# Patient Record
Sex: Female | Born: 1945 | Race: Black or African American | Hispanic: No | Marital: Single | Smoking: Former smoker
Health system: Southern US, Community
[De-identification: ages and names within clinical notes are randomized; demographics above are authoritative.]

## PROBLEM LIST (undated history)

## (undated) DIAGNOSIS — I10 Essential (primary) hypertension: Secondary | ICD-10-CM

## (undated) DIAGNOSIS — K219 Gastro-esophageal reflux disease without esophagitis: Secondary | ICD-10-CM

## (undated) DIAGNOSIS — C189 Malignant neoplasm of colon, unspecified: Secondary | ICD-10-CM

## (undated) DIAGNOSIS — M1990 Unspecified osteoarthritis, unspecified site: Secondary | ICD-10-CM

## (undated) DIAGNOSIS — R1310 Dysphagia, unspecified: Secondary | ICD-10-CM

## (undated) DIAGNOSIS — E785 Hyperlipidemia, unspecified: Secondary | ICD-10-CM

## (undated) DIAGNOSIS — F419 Anxiety disorder, unspecified: Secondary | ICD-10-CM

## (undated) DIAGNOSIS — G4733 Obstructive sleep apnea (adult) (pediatric): Secondary | ICD-10-CM

## (undated) DIAGNOSIS — F4321 Adjustment disorder with depressed mood: Secondary | ICD-10-CM

## (undated) DIAGNOSIS — I639 Cerebral infarction, unspecified: Secondary | ICD-10-CM

## (undated) DIAGNOSIS — E559 Vitamin D deficiency, unspecified: Secondary | ICD-10-CM

## (undated) DIAGNOSIS — F32A Depression, unspecified: Secondary | ICD-10-CM

## (undated) DIAGNOSIS — E118 Type 2 diabetes mellitus with unspecified complications: Secondary | ICD-10-CM

## (undated) HISTORY — DX: Vitamin D deficiency, unspecified: E55.9

## (undated) HISTORY — DX: Gastro-esophageal reflux disease without esophagitis: K21.9

## (undated) HISTORY — PX: ABDOMINAL HYSTERECTOMY: SHX81

## (undated) HISTORY — DX: Obstructive sleep apnea (adult) (pediatric): G47.33

## (undated) HISTORY — DX: Malignant neoplasm of colon, unspecified: C18.9

## (undated) HISTORY — DX: Adjustment disorder with depressed mood: F43.21

## (undated) HISTORY — DX: Dysphagia, unspecified: R13.10

## (undated) HISTORY — DX: Essential (primary) hypertension: I10

## (undated) HISTORY — DX: Cerebral infarction, unspecified: I63.9

## (undated) HISTORY — PX: COLON RESECTION: SHX5231

## (undated) HISTORY — DX: Hyperlipidemia, unspecified: E78.5

## (undated) HISTORY — DX: Type 2 diabetes mellitus with unspecified complications: E11.8

---

## 2016-05-21 ENCOUNTER — Encounter: Payer: Self-pay | Admitting: Gastroenterology

## 2016-05-21 HISTORY — PX: ESOPHAGOGASTRODUODENOSCOPY: SHX1529

## 2016-07-28 HISTORY — PX: COLONOSCOPY: SHX174

## 2018-04-21 DIAGNOSIS — Z853 Personal history of malignant neoplasm of breast: Secondary | ICD-10-CM

## 2018-04-21 HISTORY — DX: Personal history of malignant neoplasm of breast: Z85.3

## 2018-04-21 HISTORY — PX: BREAST LUMPECTOMY: SHX2

## 2019-04-14 ENCOUNTER — Encounter: Payer: Self-pay | Admitting: Family Medicine

## 2019-05-31 ENCOUNTER — Encounter: Payer: Self-pay | Admitting: Internal Medicine

## 2019-06-14 ENCOUNTER — Encounter: Payer: Self-pay | Admitting: Gastroenterology

## 2019-06-14 NOTE — Progress Notes (Signed)
Referring Provider: Michell Heinrich, DO Primary Care Physician:  Michell Heinrich, DO Primary Gastroenterologist:  Dr. Gala Romney  Chief Complaint  Patient presents with  . Abdominal Pain  . Dysphagia  . constipation to diarrhea    HPI:   Veronica Wiley is a 74 y.o. female presenting today at the request of Addis, Daniel, DO for diarrhea.  Patient also has concerns about upper abdominal pain and dysphagia.   Reviewed last 3 PCP notes in care everywhere dated 03/21/2019, 03/28/2019, and 05/31/2019.  Reports patient has history of GERD, dysphagia with 3 dilations in the past, and chronic intermittent diarrhea for greater than 2 years requesting GI referral outside of [place].  Prior referral to [facility] for dysphagia but appointment never made.  Reviewed labs in care everywhere dated 03/21/19. LFT, electrolytes, and kidney function within normal limits.  Hemoglobin, WBC, and platelets within normal limits.  Today: Abdominal Pain: Started 6 months-1 year ago. Mostly in the upper abdomen. Present every time she eats. States it can be anything she eats. Will be about a 7/10. Will last a few hours. Sometimes she will take a pepcid. This helps some. Quality is sharp at times. Feels very full in the upper abdomen. Admits to early satiety. Present for about 1 year.  Used to be on Protonix for 20 + years but PCP stopped this last Thursday. Has acid reflux symptoms several times a week. Some nausea. No vomiting. When she swallows, food will get stuck around sternal notch, other times at the very low esophagus. Has had esophageal dilation x 2. Last one was about 2 years ago. This does help with trouble swallowing for a while.    History of colon cancer in [place] about 15 years ago. Had partial colon resection on upper left side. No chemo. Last colonoscopy with Dr. Earley Brooke 4-5 years ago. Reports having a polyp.  States she is not sure when she supposed to have another one, but her PCP always lets her  know.  Admits to postprandial diarrhea. Typically loose/watery. About 4 BMs daily. Stools have always been loose/watery. Present for a couple of years.  Consumes dairy products fairly regularly. No blood in the stool. No black stools. Occasional cramping prior to a BM. Occasional constipation. May go 3 days without a BM. Not regularly. Never tried any medication in the past for this. No nocturnal stools.   On chemo pill now because of breast cancer. 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  Has had 3 surgeries since diagnosis.  States the surgeon told her he got it all with the last surgery.  No NSAIDs.   Past Medical History:  Diagnosis Date  . Adjustment disorder with depressed mood   . Dysphagia   . GERD (gastroesophageal reflux disease)   . History of breast cancer 2020  . Hyperlipidemia   . Malignant tumor of colon ([place])    About 15 years ago  . OSA (obstructive sleep apnea)    on CPAP  . 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 Type 2 diabetes mellitus with complications ([place])   . Vitamin D deficiency     Past Surgical History:  Procedure Laterality Date  . BREAST LUMPECTOMY Right 2020   x3  . COLON RESECTION     about 15 years ago; left-sided (per patient)  . COLONOSCOPY    . ESOPHAGOGASTRODUODENOSCOPY      Current Outpatient Medications  Medication Sig Dispense Refill  . aspirin EC 81  MG tablet Take 81 mg by mouth daily.    Marland Kitchen atorvastatin (LIPITOR) 80 MG tablet Take 80 mg by mouth daily.    . clopidogrel (PLAVIX) 75 MG tablet Take 75 mg by mouth daily.    . Insulin Infusion Pump DEVI by Does not apply route. As directed    . letrozole (FEMARA) 2.5 MG tablet Take 2.5 mg by mouth daily.    . NON FORMULARY Vitamin D3  2000 units daily    . sertraline (ZOLOFT) 100 MG tablet Take 100 mg by mouth daily.    Marland Kitchen losartan (COZAAR) 25 MG tablet Take 25 mg by mouth daily. Take I/2 tablet daily     No current facility-administered  medications for this visit.    Allergies as of 06/15/2019 - Review Complete 06/15/2019  Allergen Reaction Noted  . Enoxaparin Other (See Comments) 06/15/2019  . Ivp dye [iodinated diagnostic agents] Other (See Comments) 06/15/2019  . Penicillins Rash 06/15/2019    Family History  Problem Relation Age of Onset  . Colon cancer Neg Hx     Social History   Socioeconomic History  . Marital status: Single    Spouse name: Not on file  . Number of children: Not on file  . Years of education: Not on file  . Highest education level: Not on file  Occupational History  . Not on file  Tobacco Use  . Smoking status: Former Research scientist (life sciences)  . Smokeless tobacco: Never Used  . Tobacco comment: Never did smoke much just one or two daily  Substance and Sexual Activity  . Alcohol use: Not Currently  . Drug use: Never  . Sexual activity: Not on file  Other Topics Concern  . Not on file  Social History Narrative  . Not on file   Social Determinants of Health   Financial Resource Strain:   . Difficulty of Paying Living Expenses: Not on file  Food Insecurity:   . Worried About Charity fundraiser in the Last Year: Not on file  . Ran Out of Food in the Last Year: Not on file  Transportation Needs:   . Lack of Transportation (Medical): Not on file  . Lack of Transportation (Non-Medical): Not on file  Physical Activity:   . Days of Exercise per Week: Not on file  . Minutes of Exercise per Session: Not on file  Stress:   . Feeling of Stress : Not on file  Social Connections:   . Frequency of Communication with Friends and Family: Not on file  . Frequency of Social Gatherings with Friends and Family: Not on file  . Attends Religious Services: Not on file  . Active Member of Clubs or Organizations: Not on file  . Attends Archivist Meetings: Not on file  . Marital Status: Not on file  Intimate Partner Violence:   . Fear of Current or Ex-Partner: Not on file  . Emotionally Abused: Not  on file  . Physically Abused: Not on file  . Sexually Abused: Not on file    Review of Systems: Gen: Denies any fever, chills, lightheadedness, dizziness, presyncope, or syncope. CV: Denies chest pain or heart palpitations.  Resp: Occasional shortness of breath with exertion. No shortness of breath at rest. Denies  cough.  GI: See HPI GU : Denies urinary burning, urinary frequency, urinary hesitancy MS: Chronic knee pain. Takes tylenol as needed.  Derm: Denies rash Psych: Admits to depression.  No SI or HI.   Heme: Denies bruising or bleeding.  Physical Exam: BP (!) 144/77   Pulse (!) 116   Temp (!) 96.8 F (36 C) (Temporal)   Ht 5\' 2"  (1.575 m)   Wt 198 lb 9.6 oz (90.1 kg)   BMI 36.32 kg/m  General:   Alert and oriented. Pleasant and cooperative. Well-nourished and well-developed.   Head:  Normocephalic and atraumatic. Eyes:  Without icterus, sclera clear and conjunctiva pink.  Ears:  Normal auditory acuity. Lungs:  Clear to auscultation bilaterally. No wheezes, rales, or rhonchi. No distress.  Heart:  S1, S2 present without murmurs appreciated.  Tachycardia resolved after sitting. Abdomen:  +BS, soft, and non-distended. Moderate tenderness to palpation in the epigastric area. Mild tenderness in RUQ and LUQ. No HSM noted. No guarding or rebound. No masses appreciated.  Rectal:  Deferred  Msk:  Symmetrical without gross deformities. Normal posture. Extremities:  Without edema. Neurologic:  Alert and  oriented x4;  grossly normal neurologically. Skin:  Intact without significant lesions or rashes. Psych: Normal mood and affect.

## 2019-06-14 NOTE — H&P (View-Only) (Signed)
Referring Provider: Michell Heinrich, DO Primary Care Physician:  Michell Heinrich, DO Primary Gastroenterologist:  Dr. Gala Romney  Chief Complaint  Patient presents with  . Abdominal Pain  . Dysphagia  . constipation to diarrhea    HPI:   Veronica Wiley is a 74 y.o. female presenting today at the request of Addis, Daniel, DO for diarrhea.  Patient also has concerns about upper abdominal pain and dysphagia.   Reviewed last 3 PCP notes in care everywhere dated 03/21/2019, 03/28/2019, and 05/31/2019.  Reports patient has history of GERD, dysphagia with 3 dilations in the past, and chronic intermittent diarrhea for greater than 2 years requesting GI referral outside of [place].  Prior referral to [facility] for dysphagia but appointment never made.  Reviewed labs in care everywhere dated 03/21/19. LFT, electrolytes, and kidney function within normal limits.  Hemoglobin, WBC, and platelets within normal limits.  Today: Abdominal Pain: Started 6 months-1 year ago. Mostly in the upper abdomen. Present every time she eats. States it can be anything she eats. Will be about a 7/10. Will last a few hours. Sometimes she will take a pepcid. This helps some. Quality is sharp at times. Feels very full in the upper abdomen. Admits to early satiety. Present for about 1 year.  Used to be on Protonix for 20 + years but PCP stopped this last Thursday. Has acid reflux symptoms several times a week. Some nausea. No vomiting. When she swallows, food will get stuck around sternal notch, other times at the very low esophagus. Has had esophageal dilation x 2. Last one was about 2 years ago. This does help with trouble swallowing for a while.    History of colon cancer in [place] about 15 years ago. Had partial colon resection on upper left side. No chemo. Last colonoscopy with Dr. Earley Brooke 4-5 years ago. Reports having a polyp.  States she is not sure when she supposed to have another one, but her PCP always lets her  know.  Admits to postprandial diarrhea. Typically loose/watery. About 4 BMs daily. Stools have always been loose/watery. Present for a couple of years.  Consumes dairy products fairly regularly. No blood in the stool. No black stools. Occasional cramping prior to a BM. Occasional constipation. May go 3 days without a BM. Not regularly. Never tried any medication in the past for this. No nocturnal stools.   On chemo pill now because of breast cancer. 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  Has had 3 surgeries since diagnosis.  States the surgeon told her he got it all with the last surgery.  No NSAIDs.   Past Medical History:  Diagnosis Date  . Adjustment disorder with depressed mood   . Dysphagia   . GERD (gastroesophageal reflux disease)   . History of breast cancer 2020  . Hyperlipidemia   . Malignant tumor of colon ([place])    About 15 years ago  . OSA (obstructive sleep apnea)    on CPAP  . 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 Type 2 diabetes mellitus with complications ([place])   . Vitamin D deficiency     Past Surgical History:  Procedure Laterality Date  . BREAST LUMPECTOMY Right 2020   x3  . COLON RESECTION     about 15 years ago; left-sided (per patient)  . COLONOSCOPY    . ESOPHAGOGASTRODUODENOSCOPY      Current Outpatient Medications  Medication Sig Dispense Refill  . aspirin EC 81  MG tablet Take 81 mg by mouth daily.    Marland Kitchen atorvastatin (LIPITOR) 80 MG tablet Take 80 mg by mouth daily.    . clopidogrel (PLAVIX) 75 MG tablet Take 75 mg by mouth daily.    . Insulin Infusion Pump DEVI by Does not apply route. As directed    . letrozole (FEMARA) 2.5 MG tablet Take 2.5 mg by mouth daily.    . NON FORMULARY Vitamin D3  2000 units daily    . sertraline (ZOLOFT) 100 MG tablet Take 100 mg by mouth daily.    Marland Kitchen losartan (COZAAR) 25 MG tablet Take 25 mg by mouth daily. Take I/2 tablet daily     No current facility-administered  medications for this visit.    Allergies as of 06/15/2019 - Review Complete 06/15/2019  Allergen Reaction Noted  . Enoxaparin Other (See Comments) 06/15/2019  . Ivp dye [iodinated diagnostic agents] Other (See Comments) 06/15/2019  . Penicillins Rash 06/15/2019    Family History  Problem Relation Age of Onset  . Colon cancer Neg Hx     Social History   Socioeconomic History  . Marital status: Single    Spouse name: Not on file  . Number of children: Not on file  . Years of education: Not on file  . Highest education level: Not on file  Occupational History  . Not on file  Tobacco Use  . Smoking status: Former Research scientist (life sciences)  . Smokeless tobacco: Never Used  . Tobacco comment: Never did smoke much just one or two daily  Substance and Sexual Activity  . Alcohol use: Not Currently  . Drug use: Never  . Sexual activity: Not on file  Other Topics Concern  . Not on file  Social History Narrative  . Not on file   Social Determinants of Health   Financial Resource Strain:   . Difficulty of Paying Living Expenses: Not on file  Food Insecurity:   . Worried About Charity fundraiser in the Last Year: Not on file  . Ran Out of Food in the Last Year: Not on file  Transportation Needs:   . Lack of Transportation (Medical): Not on file  . Lack of Transportation (Non-Medical): Not on file  Physical Activity:   . Days of Exercise per Week: Not on file  . Minutes of Exercise per Session: Not on file  Stress:   . Feeling of Stress : Not on file  Social Connections:   . Frequency of Communication with Friends and Family: Not on file  . Frequency of Social Gatherings with Friends and Family: Not on file  . Attends Religious Services: Not on file  . Active Member of Clubs or Organizations: Not on file  . Attends Archivist Meetings: Not on file  . Marital Status: Not on file  Intimate Partner Violence:   . Fear of Current or Ex-Partner: Not on file  . Emotionally Abused: Not  on file  . Physically Abused: Not on file  . Sexually Abused: Not on file    Review of Systems: Gen: Denies any fever, chills, lightheadedness, dizziness, presyncope, or syncope. CV: Denies chest pain or heart palpitations.  Resp: Occasional shortness of breath with exertion. No shortness of breath at rest. Denies  cough.  GI: See HPI GU : Denies urinary burning, urinary frequency, urinary hesitancy MS: Chronic knee pain. Takes tylenol as needed.  Derm: Denies rash Psych: Admits to depression.  No SI or HI.   Heme: Denies bruising or bleeding.  Physical Exam: BP (!) 144/77   Pulse (!) 116   Temp (!) 96.8 F (36 C) (Temporal)   Ht 5\' 2"  (1.575 m)   Wt 198 lb 9.6 oz (90.1 kg)   BMI 36.32 kg/m  General:   Alert and oriented. Pleasant and cooperative. Well-nourished and well-developed.   Head:  Normocephalic and atraumatic. Eyes:  Without icterus, sclera clear and conjunctiva pink.  Ears:  Normal auditory acuity. Lungs:  Clear to auscultation bilaterally. No wheezes, rales, or rhonchi. No distress.  Heart:  S1, S2 present without murmurs appreciated.  Tachycardia resolved after sitting. Abdomen:  +BS, soft, and non-distended. Moderate tenderness to palpation in the epigastric area. Mild tenderness in RUQ and LUQ. No HSM noted. No guarding or rebound. No masses appreciated.  Rectal:  Deferred  Msk:  Symmetrical without gross deformities. Normal posture. Extremities:  Without edema. Neurologic:  Alert and  oriented x4;  grossly normal neurologically. Skin:  Intact without significant lesions or rashes. Psych: Normal mood and affect.

## 2019-06-15 ENCOUNTER — Ambulatory Visit (INDEPENDENT_AMBULATORY_CARE_PROVIDER_SITE_OTHER): Payer: Medicare Other | Admitting: Gastroenterology

## 2019-06-15 ENCOUNTER — Encounter: Payer: Self-pay | Admitting: Gastroenterology

## 2019-06-15 ENCOUNTER — Other Ambulatory Visit: Payer: Self-pay

## 2019-06-15 VITALS — BP 144/77 | HR 116 | Temp 96.8°F | Ht 62.0 in | Wt 198.6 lb

## 2019-06-15 DIAGNOSIS — R1010 Upper abdominal pain, unspecified: Secondary | ICD-10-CM | POA: Diagnosis not present

## 2019-06-15 DIAGNOSIS — R1310 Dysphagia, unspecified: Secondary | ICD-10-CM

## 2019-06-15 DIAGNOSIS — R6881 Early satiety: Secondary | ICD-10-CM | POA: Diagnosis not present

## 2019-06-15 DIAGNOSIS — K219 Gastro-esophageal reflux disease without esophagitis: Secondary | ICD-10-CM | POA: Diagnosis not present

## 2019-06-15 DIAGNOSIS — Z85038 Personal history of other malignant neoplasm of large intestine: Secondary | ICD-10-CM

## 2019-06-15 DIAGNOSIS — R197 Diarrhea, unspecified: Secondary | ICD-10-CM

## 2019-06-15 NOTE — Patient Instructions (Addendum)
We will get you scheduled for an upper endoscopy with possible dilation in the near future with Dr. Gala Romney.   Please have labs completed.   Try Dexilant 60 mg daily. We will provide samples. Call in 2 weeks with a progress report. I will send in prescription if this works well.   Avoid fried, fatty, greasy, spicy, citrus foods. Avoid caffeine, carbonated beverages, and chocolate. No not eat within 3 hours of laying down.   Try eating 4-6 small meals daily rather than 3 main meals.   For diarrhea, follow a lactose free diet or take lactacid tablets prior to eating any dairy products. See handout below.   I am requesting your prior colonoscopy and EGD records.   We will see you back after your procedures. Call in 2 weeks with progress report.   Veronica Altes, PA-C [facility]      Food Choices for Gastroesophageal Reflux Disease, Adult When you have gastroesophageal reflux disease (GERD), the foods you eat and your eating habits are very important. Choosing the right foods can help ease your discomfort. Think about working with a nutrition specialist (dietitian) to help you make good choices. What are tips for following this plan?  Meals  Choose healthy foods that are low in fat, such as fruits, vegetables, whole grains, low-fat dairy products, and lean meat, fish, and poultry.  Eat small meals often instead of 3 large meals a day. Eat your meals slowly, and in a place where you are relaxed. Avoid bending over or lying down until 2-3 hours after eating.  Avoid eating meals 2-3 hours before bed.  Avoid drinking a lot of liquid with meals.  Cook foods using methods other than frying. Bake, grill, or broil food instead.  Avoid or limit: ? Chocolate. ? Peppermint or spearmint. ? Alcohol. ? Pepper. ? Black and decaffeinated coffee. ? Black and decaffeinated tea. ? Bubbly (carbonated) soft drinks. ? Caffeinated energy drinks and soft drinks.  Limit high-fat  foods such as: ? Fatty meat or fried foods. ? Whole milk, cream, butter, or ice cream. ? Nuts and nut butters. ? Pastries, donuts, and sweets made with butter or shortening.  Avoid foods that cause symptoms. These foods may be different for everyone. Common foods that cause symptoms include: ? Tomatoes. ? Oranges, lemons, and limes. ? Peppers. ? Spicy food. ? Onions and garlic. ? Vinegar. Lifestyle  Maintain a healthy weight. Ask your doctor what weight is healthy for you. If you need to lose weight, work with your doctor to do so safely.  Exercise for at least 30 minutes for 5 or more days each week, or as told by your doctor.  Wear loose-fitting clothes.  Do not smoke. If you need help quitting, ask your doctor.  Sleep with the head of your bed higher than your feet. Use a wedge under the mattress or blocks under the bed frame to raise the head of the bed. Summary  When you have gastroesophageal reflux disease (GERD), food and lifestyle choices are very important in easing your symptoms.  Eat small meals often instead of 3 large meals a day. Eat your meals slowly, and in a place where you are relaxed.  Limit high-fat foods such as fatty meat or fried foods.  Avoid bending over or lying down until 2-3 hours after eating.  Avoid peppermint and spearmint, caffeine, alcohol, and chocolate. This information is not intended to replace advice given to you by your health care provider. Make sure you discuss  any questions you have with your health care provider. Document Revised: 07/29/2018 Document Reviewed: 05/13/2016 Elsevier Patient Education  [place].  Lactose-Free Diet, Adult If you have lactose intolerance, you are not able to digest lactose. Lactose is a natural sugar found mainly in dairy milk and dairy products. You may need to avoid all foods and beverages that contain lactose. A lactose-free diet can help you do this. Which foods have lactose? Lactose is  found in dairy milk and dairy products, such as:  Yogurt.  Cheese.  Butter.  Margarine.  Sour cream.  Cream.  Whipped toppings and nondairy creamers.  Ice cream and other dairy-based desserts. Lactose is also found in foods or products made with dairy milk or milk ingredients. To find out whether a food contains dairy milk or a milk ingredient, look at the ingredients list. Avoid foods with the statement "May contain milk" and foods that contain:  Milk powder.  Whey.  Curd.  Caseinate.  Lactose.  Lactalbumin.  Lactoglobulin. What are alternatives to dairy milk and foods made with milk products?  Lactose-free milk.  Soy milk with added calcium and vitamin D.  Almond milk, coconut milk, rice milk, or other nondairy milk alternatives with added calcium and vitamin D. Note that these are low in protein.  Soy products, such as soy yogurt, soy cheese, soy ice cream, and soy-based sour cream.  Other nut milk products, such as almond yogurt, almond cheese, cashew yogurt, cashew cheese, cashew ice cream, coconut yogurt, and coconut ice cream. What are tips for following this plan?  Do not consume foods, beverages, vitamins, minerals, or medicines containing lactose. Read ingredient lists carefully.  Look for the words "lactose-free" on labels.  Use lactase enzyme drops or tablets as directed by your health care provider.  Use lactose-free milk or a milk alternative, such as soy milk or almond milk, for drinking and cooking.  Make sure you get enough calcium and vitamin D in your diet. A lactose-free eating plan can be lacking in these important nutrients.  Take calcium and vitamin D supplements as directed by your health care provider. Talk to your health care provider about supplements if you are not able to get enough calcium and vitamin D from food. What foods can I eat?  Fruits All fresh, canned, frozen, or dried fruits that are not processed with  lactose. Vegetables All fresh, frozen, and canned vegetables without cheese, cream, or butter sauces. Grains Any that are not made with dairy milk or dairy products. Meats and other proteins Any meat, fish, poultry, and other protein sources that are not made with dairy milk or dairy products. Soy cheese and yogurt. Fats and oils Any that are not made with dairy milk or dairy products. Beverages Lactose-free milk. Soy, rice, or almond milk with added calcium and vitamin D. Fruit and vegetable juices. Sweets and desserts Any that are not made with dairy milk or dairy products. Seasonings and condiments Any that are not made with dairy milk or dairy products. Calcium Calcium is found in many foods that contain lactose and is important for bone health. The amount of calcium you need depends on your age:  Adults younger than 50 years: 1,000 mg of calcium a day.  Adults older than 50 years: 1,200 mg of calcium a day. If you are not getting enough calcium, you may get it from other sources, including:  Orange juice with calcium added. There are 300-350 mg of calcium in 1 cup of orange  juice.  Calcium-fortified soy milk. There are 300-400 mg of calcium in 1 cup of calcium-fortified soy milk.  Calcium-fortified rice or almond milk. There are 300 mg of calcium in 1 cup of calcium-fortified rice or almond milk.  Calcium-fortified breakfast cereals. There are 100-1,000 mg of calcium in calcium-fortified breakfast cereals.  Spinach, cooked. There are 145 mg of calcium in  cup of cooked spinach.  Edamame, cooked. There are 130 mg of calcium in  cup of cooked edamame.  Collard greens, cooked. There are 125 mg of calcium in  cup of cooked collard greens.  Kale, frozen or cooked. There are 90 mg of calcium in  cup of cooked or frozen kale.  Almonds. There are 95 mg of calcium in  cup of almonds.  Broccoli, cooked. There are 60 mg of calcium in 1 cup of cooked broccoli. The items listed  above may not be a complete list of recommended foods and beverages. Contact a dietitian for more options. What foods are not recommended? Fruits None, unless they are made with dairy milk or dairy products. Vegetables None, unless they are made with dairy milk or dairy products. Grains Any grains that are made with dairy milk or dairy products. Meats and other proteins None, unless they are made with dairy milk or dairy products. Dairy All dairy products, including milk, goat's milk, buttermilk, kefir, acidophilus milk, flavored milk, evaporated milk, condensed milk, dulce de Whitefish, eggnog, yogurt, cheese, and cheese spreads. Fats and oils Any that are made with milk or milk products. Margarines and salad dressings that contain milk or cheese. Cream. Half and half. Cream cheese. Sour cream. Chip dips made with sour cream or yogurt. Beverages Hot chocolate. Cocoa with lactose. Instant iced teas. Powdered fruit drinks. Smoothies made with dairy milk or yogurt. Sweets and desserts Any that are made with milk or milk products. Seasonings and condiments Chewing gum that has lactose. Spice blends if they contain lactose. Artificial sweeteners that contain lactose. Nondairy creamers. The items listed above may not be a complete list of foods and beverages to avoid. Contact a dietitian for more information. Summary  If you are lactose intolerant, it means that you have a hard time digesting lactose, a natural sugar found in milk and milk products.  Following a lactose-free diet can help you manage this condition.  Calcium is important for bone health and is found in many foods that contain lactose. Talk with your health care provider about other sources of calcium. This information is not intended to replace advice given to you by your health care provider. Make sure you discuss any questions you have with your health care provider. Document Revised: 05/05/2017 Document Reviewed:  05/05/2017 Elsevier Patient Education  2020 [employer].

## 2019-06-16 ENCOUNTER — Telehealth: Payer: Self-pay

## 2019-06-16 NOTE — Telephone Encounter (Signed)
Called pt, ASAP EGD/-/+DIL w/RMR scheduled for 07/01/19 at 2:45pm. COVID test 06/29/19 at 10:00am. Orders entered. Appt letter and procedure instructions mailed.

## 2019-06-19 ENCOUNTER — Encounter: Payer: Self-pay | Admitting: Gastroenterology

## 2019-06-19 DIAGNOSIS — K219 Gastro-esophageal reflux disease without esophagitis: Secondary | ICD-10-CM | POA: Insufficient documentation

## 2019-06-19 DIAGNOSIS — R197 Diarrhea, unspecified: Secondary | ICD-10-CM | POA: Insufficient documentation

## 2019-06-19 DIAGNOSIS — R1310 Dysphagia, unspecified: Secondary | ICD-10-CM | POA: Insufficient documentation

## 2019-06-19 DIAGNOSIS — Z85038 Personal history of other malignant neoplasm of large intestine: Secondary | ICD-10-CM | POA: Insufficient documentation

## 2019-06-19 DIAGNOSIS — R1010 Upper abdominal pain, unspecified: Secondary | ICD-10-CM | POA: Insufficient documentation

## 2019-06-19 DIAGNOSIS — R6881 Early satiety: Secondary | ICD-10-CM | POA: Insufficient documentation

## 2019-06-19 NOTE — Assessment & Plan Note (Addendum)
74 year old female with history significant for GERD, diabetes, and colon cancer reporting postprandial upper abdominal pain x6 months-1 year.  Pepcid improves symptoms.  Also admits to upper abdominal fullness and early satiety x1 year.  Chronic GERD; had been on Protonix for 20+ years, but PCP discontinued this about 1 week ago until she followed up with GI.  Now with GERD symptoms several times a week along with nausea but no vomiting.  No melena or bright red blood per rectum.  No NSAIDs.  Abdominal exam with moderate tenderness to palpation in the epigastric area.  Mild tenderness to palpation in the RUQ and LUQ.  Differentials include gastritis, esophagitis, duodenitis, PUD, H. pylori, gastroparesis, pyloric stenosis, biliary etiology, or malignancy.  Doubt pancreatitis considering chronicity.  Patient needs daily PPI therapy and EGD for further evaluation.  Update CBC, CMP, and lipase. Trial Dexilant 60 mg daily.  Samples provided.  Requested 2-week progress report.  We will send in prescription if Dexilant works well. Proceed with EGD +/- dilation with Dr. Gala Romney in the near future. The risks, benefits, and alternatives have been discussed in detail with patient. They have stated understanding and desire to proceed.  Counseled on GERD diet and lifestyle.  Handout provided. Follow-up after procedure.  Call if questions or concerns prior.

## 2019-06-19 NOTE — Assessment & Plan Note (Addendum)
74 year old female reporting chronic history of GERD.  Had been on Protonix for 20+ years, but PCP advised she discontinue this medication until following up with GI.  Currently with GERD symptoms several times a week, nausea, dysphagia, postprandial epigastric pain that improves some with Pepcid, and early satiety.  Denies NSAIDs.  Denies bright red blood per rectum or melena.  Per patient, she has had 2 esophageal dilations in the past that do improve dysphagia symptoms.  Patient needs to be on PPI for chronic GERD symptoms.  Considering other upper GI symptoms, patient needs EGD for further evaluation.  Differentials include gastritis, esophagitis, duodenitis, PUD, H. pylori, gastroparesis, pyloric stenosis, biliary etiology, or possible malignancy.   Update CBC, CMP, and lipase. Trial Dexilant 60 mg daily.  Samples provided.  Patient will call with progress report in 2 weeks.  If this works well, I will send in a prescription. Proceed with EGD +/- dilation with Dr. Gala Romney in the near future. The risks, benefits, and alternatives have been discussed in detail with patient. They have stated understanding and desire to proceed.  Counseled on GERD diet and lifestyle.  Handout provided. Request EGD records from Dr. Earley Brooke.  Follow-up after procedures.  Call if questions or concerns prior.

## 2019-06-19 NOTE — Assessment & Plan Note (Signed)
Addressed under pain of upper abdomen as well as GERD.

## 2019-06-19 NOTE — Assessment & Plan Note (Addendum)
Dysphagia with symptoms of food getting stuck around the sternal notch or very low esophagus.  This is in the setting of chronic GERD which is currently uncontrolled as patient is off PPI.  Reports 2 esophageal dilations in the past which have helped with dysphagia symptoms.  Suspect esophageal web, ring, or stricture.  Cannot rule out malignancy.  Proceed with EGD +/- dilation with Dr. Gala Romney in the near future. The risks, benefits, and alternatives have been discussed in detail with patient. They have stated understanding and desire to proceed.  Trial Dexilant 60 mg daily.  Samples provided.  Requested 2-week progress report.  Will provide prescription if Dexilant works well. Counseled on GERD diet and lifestyle.  Handout provided. Request prior EGD records from Dr. Earley Brooke.  Follow-up after procedures.  Call if questions or concerns prior.

## 2019-06-19 NOTE — Assessment & Plan Note (Signed)
74 year old female reporting history of colon cancer about 15 years ago in [place] s/p partial left-sided colectomy and no adjuvant therapy.  She reports her last colonoscopy was 4-5 years ago with Dr. Earley Brooke.  Thinks she had a polyp.  Unsure when her next colonoscopy is due.  Requesting colonoscopy records from Dr. Earley Brooke to determine timing of next colonoscopy. Further recommendations to follow.

## 2019-06-19 NOTE — Assessment & Plan Note (Addendum)
74 year old female reporting chronic (at least 2-year history) of loose/watery stools with about 4 BMs daily.  Stools are typically postprandial.  Occasional cramping prior to BMs.  Denies nocturnal stools.  Consumes dairy products frequently. No NSAIDs. She does take Zoloft. Denies bright red blood per rectum or melena.  Per patient, history of colon cancer about 15 years ago with partial left-sided colectomy in [place] with no adjunct therapy required.  She reports her last colonoscopy was with Dr. Earley Brooke about 4-5 years ago.  Unsure when her next colonoscopy is due.   Differentials include dietary intolerances (lactose or gluten), IBS, or diarrhea secondary to changes in anatomy s/p partial colectomy. Can't rule out thyroid abnormalities or microscopic colitis. Doubt infectious etiology considering chronicity, lack of profuse watery diarrhea, and no nocturnal symptoms.   Check CBC, CMP, TSH, IgA, TTG IgA Advised she follow a lactose-free diet or take Lactaid pills prior to consuming any dairy products at least for the next 2 weeks.  Lactose-free diet handout provided. Request prior colonoscopy records from Dr. Earley Brooke to determine timing of next colonoscopy. If labs are unremarkable and there is no improvement with lactose-free diet, will likely try Imodium.  Ultimately, she may need colonoscopy for further evaluation.  Due to significant upper GI issues as discussed above, she is being scheduled for EGD +/- dilation in the near future.   Plans to follow-up after EGD but advised to call if she had questions or concerns prior.

## 2019-06-21 ENCOUNTER — Encounter: Payer: Self-pay | Admitting: Gastroenterology

## 2019-06-21 ENCOUNTER — Telehealth: Payer: Self-pay | Admitting: Gastroenterology

## 2019-06-21 NOTE — Telephone Encounter (Signed)
Received and reviewed most recent colonoscopy and EGD completed by Dr. Earley Brooke.  Colonoscopy 07/28/2016 for colon cancer surveillance Findings: Hemorrhoids, diverticulosis (few in the sigmoid colon), colon polyps (two 2-3 mm polyps biopsied from the rectum). Pathology revealed hyperplastic polyp.  Based off history of colon cancer and this colonoscopy report, patient would be due for repeat colonoscopy in 2023, health permitting.  EGD 05/21/2016 for dysphagia Findings: Hiatal hernia, polyps (2-3 mm inflammatory looking polyps in the antrum, biopsies taken).  Esophageal balloon dilation performed.  No further recommendations at this time.  We will proceed with EGD as planned and follow-up in office thereafter.  We will have TCS and EGD report scanned into patient's chart.

## 2019-06-29 ENCOUNTER — Other Ambulatory Visit (HOSPITAL_COMMUNITY)
Admission: RE | Admit: 2019-06-29 | Discharge: 2019-06-29 | Disposition: A | Discharge status: Home / Self Care | Payer: Medicare Other | Source: Ambulatory Visit | Attending: Internal Medicine | Admitting: Internal Medicine

## 2019-06-29 ENCOUNTER — Other Ambulatory Visit: Payer: Self-pay

## 2019-06-29 DIAGNOSIS — Z20822 Contact with and (suspected) exposure to covid-19: Secondary | ICD-10-CM | POA: Diagnosis not present

## 2019-06-29 DIAGNOSIS — Z01812 Encounter for preprocedural laboratory examination: Secondary | ICD-10-CM | POA: Diagnosis present

## 2019-06-29 LAB — SARS CORONAVIRUS 2 (TAT 6-24 HRS): SARS Coronavirus 2: NEGATIVE

## 2019-07-01 ENCOUNTER — Encounter (HOSPITAL_COMMUNITY)
Admission: RE | Disposition: A | Discharge status: Home / Self Care | Payer: Self-pay | Source: Home / Self Care | Attending: Internal Medicine

## 2019-07-01 ENCOUNTER — Ambulatory Visit (HOSPITAL_COMMUNITY)
Admission: RE | Admit: 2019-07-01 | Discharge: 2019-07-01 | Disposition: A | Discharge status: Home / Self Care | Payer: Medicare Other | Attending: Internal Medicine | Admitting: Internal Medicine

## 2019-07-01 ENCOUNTER — Other Ambulatory Visit: Payer: Self-pay

## 2019-07-01 ENCOUNTER — Encounter (HOSPITAL_COMMUNITY): Payer: Self-pay | Admitting: Internal Medicine

## 2019-07-01 ENCOUNTER — Encounter: Payer: Self-pay | Admitting: Internal Medicine

## 2019-07-01 DIAGNOSIS — R109 Unspecified abdominal pain: Secondary | ICD-10-CM | POA: Diagnosis present

## 2019-07-01 DIAGNOSIS — R197 Diarrhea, unspecified: Secondary | ICD-10-CM | POA: Insufficient documentation

## 2019-07-01 DIAGNOSIS — K209 Esophagitis, unspecified without bleeding: Secondary | ICD-10-CM | POA: Insufficient documentation

## 2019-07-01 DIAGNOSIS — Z79811 Long term (current) use of aromatase inhibitors: Secondary | ICD-10-CM | POA: Diagnosis not present

## 2019-07-01 DIAGNOSIS — E785 Hyperlipidemia, unspecified: Secondary | ICD-10-CM | POA: Insufficient documentation

## 2019-07-01 DIAGNOSIS — E119 Type 2 diabetes mellitus without complications: Secondary | ICD-10-CM | POA: Diagnosis not present

## 2019-07-01 DIAGNOSIS — Z7902 Long term (current) use of antithrombotics/antiplatelets: Secondary | ICD-10-CM | POA: Diagnosis not present

## 2019-07-01 DIAGNOSIS — K2950 Unspecified chronic gastritis without bleeding: Secondary | ICD-10-CM | POA: Insufficient documentation

## 2019-07-01 DIAGNOSIS — C50919 Malignant neoplasm of unspecified site of unspecified female breast: Secondary | ICD-10-CM | POA: Insufficient documentation

## 2019-07-01 DIAGNOSIS — Z79899 Other long term (current) drug therapy: Secondary | ICD-10-CM | POA: Insufficient documentation

## 2019-07-01 DIAGNOSIS — Z794 Long term (current) use of insulin: Secondary | ICD-10-CM | POA: Diagnosis not present

## 2019-07-01 DIAGNOSIS — I69354 Hemiplegia and hemiparesis following cerebral infarction affecting left non-dominant side: Secondary | ICD-10-CM | POA: Diagnosis not present

## 2019-07-01 DIAGNOSIS — R1310 Dysphagia, unspecified: Secondary | ICD-10-CM | POA: Diagnosis not present

## 2019-07-01 DIAGNOSIS — G4733 Obstructive sleep apnea (adult) (pediatric): Secondary | ICD-10-CM | POA: Insufficient documentation

## 2019-07-01 DIAGNOSIS — R1010 Upper abdominal pain, unspecified: Secondary | ICD-10-CM | POA: Insufficient documentation

## 2019-07-01 DIAGNOSIS — Z87891 Personal history of nicotine dependence: Secondary | ICD-10-CM | POA: Insufficient documentation

## 2019-07-01 DIAGNOSIS — Z7982 Long term (current) use of aspirin: Secondary | ICD-10-CM | POA: Diagnosis not present

## 2019-07-01 DIAGNOSIS — Z85038 Personal history of other malignant neoplasm of large intestine: Secondary | ICD-10-CM | POA: Insufficient documentation

## 2019-07-01 DIAGNOSIS — K228 Other specified diseases of esophagus: Secondary | ICD-10-CM

## 2019-07-01 HISTORY — PX: BIOPSY: SHX5522

## 2019-07-01 HISTORY — PX: ESOPHAGOGASTRODUODENOSCOPY: SHX5428

## 2019-07-01 HISTORY — PX: MALONEY DILATION: SHX5535

## 2019-07-01 LAB — CBC
HCT: 37.2 % (ref 36.0–46.0)
Hemoglobin: 11.3 g/dL — ABNORMAL LOW (ref 12.0–15.0)
MCH: 27.6 pg (ref 26.0–34.0)
MCHC: 30.4 g/dL (ref 30.0–36.0)
MCV: 91 fL (ref 80.0–100.0)
Platelets: 278 10*3/uL (ref 150–400)
RBC: 4.09 MIL/uL (ref 3.87–5.11)
RDW: 13.2 % (ref 11.5–15.5)
WBC: 9.1 10*3/uL (ref 4.0–10.5)
nRBC: 0 % (ref 0.0–0.2)

## 2019-07-01 LAB — COMPREHENSIVE METABOLIC PANEL
ALT: 20 U/L (ref 0–44)
AST: 27 U/L (ref 15–41)
Albumin: 3.2 g/dL — ABNORMAL LOW (ref 3.5–5.0)
Alkaline Phosphatase: 100 U/L (ref 38–126)
Anion gap: 6 (ref 5–15)
BUN: 10 mg/dL (ref 8–23)
CO2: 27 mmol/L (ref 22–32)
Calcium: 8.8 mg/dL — ABNORMAL LOW (ref 8.9–10.3)
Chloride: 108 mmol/L (ref 98–111)
Creatinine, Ser: 0.52 mg/dL (ref 0.44–1.00)
GFR calc Af Amer: >60 mL/min (ref >60)
GFR calc non Af Amer: >60 mL/min (ref >60)
Glucose, Bld: 87 mg/dL (ref 70–99)
Potassium: 3.7 mmol/L (ref 3.5–5.1)
Sodium: 141 mmol/L (ref 135–145)
Total Bilirubin: 0.4 mg/dL (ref 0.3–1.2)
Total Protein: 6.5 g/dL (ref 6.5–8.1)

## 2019-07-01 LAB — LIPASE, BLOOD: Lipase: 16 U/L (ref 11–51)

## 2019-07-01 LAB — GLUCOSE, CAPILLARY: Glucose-Capillary: 105 mg/dL — ABNORMAL HIGH (ref 70–99)

## 2019-07-01 SURGERY — EGD (ESOPHAGOGASTRODUODENOSCOPY)
Anesthesia: Moderate Sedation

## 2019-07-01 MED ORDER — ONDANSETRON HCL 4 MG/2ML IJ SOLN
INTRAMUSCULAR | Status: AC
  Filled 2019-07-01: qty 2

## 2019-07-01 MED ORDER — MIDAZOLAM HCL 5 MG/5ML IJ SOLN
INTRAMUSCULAR | Status: AC
  Filled 2019-07-01: qty 10

## 2019-07-01 MED ORDER — STERILE WATER FOR IRRIGATION IR SOLN
Status: DC | PRN
  Administered 2019-07-01: 2.5 mL

## 2019-07-01 MED ORDER — MIDAZOLAM HCL 5 MG/5ML IJ SOLN
INTRAMUSCULAR | Status: DC | PRN
  Administered 2019-07-01: 1 mg via INTRAVENOUS
  Administered 2019-07-01 (×2): 2 mg via INTRAVENOUS

## 2019-07-01 MED ORDER — MEPERIDINE HCL 50 MG/ML IJ SOLN
INTRAMUSCULAR | Status: AC
  Filled 2019-07-01: qty 1

## 2019-07-01 MED ORDER — LIDOCAINE VISCOUS HCL 2 % MT SOLN
OROMUCOSAL | Status: AC
  Filled 2019-07-01: qty 15

## 2019-07-01 MED ORDER — ONDANSETRON HCL 4 MG/2ML IJ SOLN
INTRAMUSCULAR | Status: DC | PRN
  Administered 2019-07-01: 4 mg via INTRAVENOUS

## 2019-07-01 MED ORDER — SODIUM CHLORIDE 0.9 % IV SOLN: INTRAVENOUS | Status: DC

## 2019-07-01 MED ORDER — LIDOCAINE VISCOUS HCL 2 % MT SOLN
OROMUCOSAL | Status: DC | PRN
  Administered 2019-07-01: 1 via OROMUCOSAL

## 2019-07-01 MED ORDER — MEPERIDINE HCL 100 MG/ML IJ SOLN
INTRAMUSCULAR | Status: DC | PRN
  Administered 2019-07-01: 25 mg via INTRAVENOUS

## 2019-07-01 NOTE — Interval H&P Note (Signed)
History and Physical Interval Note:  07/01/2019 12:04 PM  Veronica Wiley  has presented today for surgery, with the diagnosis of GERD, dysphagia, upper abdominal pain, early satiety.  The various methods of treatment have been discussed with the patient and family. After consideration of risks, benefits and other options for treatment, the patient has consented to  Procedure(s) with comments: ESOPHAGOGASTRODUODENOSCOPY (EGD) (N/A) - 2:45pm MALONEY DILATION (N/A) as a surgical intervention.  The patient's history has been reviewed, patient examined, no change in status, stable for surgery.  I have reviewed the patient's chart and labs.  Questions were answered to the patient's satisfaction.     Manus Rudd  Patient states Dexilant has helped reflux.  Still has postprandial upper abdominal discomfort.  Still has dysphagia.  Did not get labs done.  EGD with possible esophageal dilation today as feasible/appropriate. Patient states that she still has her gallbladder no imaging lately.  The risks, benefits, limitations, alternatives and imponderables have been reviewed with the patient. Potential for esophageal dilation, biopsy, etc. have also been reviewed.  Questions have been answered. All parties agreeable.

## 2019-07-01 NOTE — Discharge Instructions (Signed)

## 2019-07-01 NOTE — Op Note (Signed)
[facility] Patient Name: Veronica Wiley Procedure Date: 07/01/2019 11:53 AM MRN: [id number] Date of Birth: 1946/02/08 Attending MD: Norvel Richards , MD CSN: [id number] Age: 74 Admit Type: Outpatient Procedure:                Upper GI endoscopy Indications:              Upper abdominal pain, Dysphagia Providers:                Norvel Richards, MD, Lurline Del, RN, Randa Spike, Technician Referring MD:              Medicines:                Midazolam 5 mg IV, Meperidine 25 mg IV, Ondansetron                            4 mg IV Complications:            No immediate complications. Estimated Blood Loss:     Estimated blood loss was minimal. Procedure:                Pre-Anesthesia Assessment:                           - Prior to the procedure, a History and Physical                            was performed, and patient medications and                            allergies were reviewed. The patient's tolerance of                            previous anesthesia was also reviewed. The risks                            and benefits of the procedure and the sedation                            options and risks were discussed with the patient.                            All questions were answered, and informed consent                            was obtained. Prior Anticoagulants: The patient has                            taken no previous anticoagulant or antiplatelet                            agents. ASA Grade Assessment: II - A patient with  mild systemic disease. After reviewing the risks                            and benefits, the patient was deemed in                            satisfactory condition to undergo the procedure.                           After obtaining informed consent, the endoscope was                            passed under direct vision. Throughout the                            procedure, the  patient's blood pressure, pulse, and                            oxygen saturations were monitored continuously. The                            [id number] scope was introduced through the                            mouth, and advanced to the second part of duodenum.                            The upper GI endoscopy was accomplished without                            difficulty. The patient tolerated the procedure                            well. Scope In: 12:18:48 PM Scope Out: 12:30:28 PM Total Procedure Duration: 0 hours 11 minutes 40 seconds  Findings:      5 mm area normal mucosa "island" just above the GE junction. May be       columnar epithelium. However, overlying mucosa looked a little       irregular. Please see photos. Otherwise, esophagus appeared normal.       Patent throughout its course. Nodular GE junction. No obvious ulcer.       Patent pylorus. Normal first and second portion of the duodenum The       scope was withdrawn. Dilation was performed with a Maloney dilator with       mild resistance at 67 Fr. The dilation site was examined following       endoscope reinsertion and showed no change. Estimated blood loss was       minimal. Esophageal abnormality biopsied. Nodular gastric mucosa on the       immediate distal side of the GE junction and biopsied. Impression:               - Mucosal changes in the esophagus. Dilated.  Biopsied.                           Nodular proximal gastric mucosa of uncertain                            significance. Status post biopsy. Some of her                            symptoms sound biliary in origin. No lab work done                            as of yet. Dexilant has helped her reflux symptoms Moderate Sedation:      Moderate (conscious) sedation was administered by the endoscopy nurse       and supervised by the endoscopist. The following parameters were       monitored: oxygen saturation, heart rate,  blood pressure, respiratory       rate, EKG, adequacy of pulmonary ventilation, and response to care.       Total physician intraservice time was 24 minutes. Recommendation:           - Patient has a contact number available for                            emergencies. The signs and symptoms of potential                            delayed complications were discussed with the                            patient. Return to normal activities tomorrow.                            Written discharge instructions were provided to the                            patient.                           - Advance diet as tolerated. Continue Dexilant 60                            mg daily. Chem-12, lipase and CBC today. Follow-up                            on pathology. Procedure Code(s):        --- Professional ---                           [id number], Esophagogastroduodenoscopy, flexible,                            transoral; diagnostic, including collection of                            specimen(s) by brushing or washing, when performed                            (  separate procedure)                           43450, Dilation of esophagus, by unguided sound or                            bougie, single or multiple passes                           99153, Moderate sedation; each additional 15                            minutes intraservice time                           G0500, Moderate sedation services provided by the                            same physician or other qualified health care                            professional performing a gastrointestinal                            endoscopic service that sedation supports,                            requiring the presence of an independent trained                            observer to assist in the monitoring of the                            patient's level of consciousness and physiological                            status; initial 15 minutes of  intra-service time;                            patient age 6 years or older (additional time may                            be reported with [phone], as appropriate) Diagnosis Code(s):        --- Professional ---                           K22.8, Other specified diseases of esophagus                           R10.10, Upper abdominal pain, unspecified                           R13.10, Dysphagia, unspecified CPT copyright 2019 American Medical Association. All rights reserved. The codes documented in this report are preliminary and upon coder review may  be revised to meet current compliance requirements.  Cristopher Estimable. Leyton Brownlee, MD Norvel Richards, MD 07/01/2019 12:45:35 PM This report has been signed electronically. Number of Addenda: 0

## 2019-07-04 ENCOUNTER — Other Ambulatory Visit: Payer: Self-pay

## 2019-07-06 ENCOUNTER — Encounter: Payer: Self-pay | Admitting: Internal Medicine

## 2019-07-06 LAB — SURGICAL PATHOLOGY

## 2019-07-07 ENCOUNTER — Encounter: Payer: Self-pay | Admitting: Internal Medicine

## 2019-07-07 ENCOUNTER — Telehealth: Payer: Self-pay

## 2019-07-07 NOTE — Telephone Encounter (Signed)
OV made, cc'ed to pcp

## 2019-07-07 NOTE — Telephone Encounter (Signed)
Per RMR- Send letter to patient.  Send copy of letter with path to referring provider and PCP.  Needs ov w extender in 2 mos or so if not already done.

## 2019-08-04 NOTE — Progress Notes (Signed)
Referring Provider: Michell Heinrich, DO Primary Care Physician:  Michell Heinrich, DO Primary GI Physician: Dr. Gala Romney  Chief Complaint  Patient presents with  . Abdominal Pain    comes/goes  . Dysphagia    still ongoing, food feels like still getting stuck in throat. wants to discuss her results from EGD  . Bloated    HPI:   Veronica Wiley is a 74 y.o. female presenting today for post procedure follow-up.  She was last seen in our office at the time of initial consult on 06/15/19 for upper abdominal pain and dysphagia.  Reported postprandial upper abdominal pain for 6 months - 1 year.  Improved some by Pepcid.  Had been on Protonix for 20+ years for GERD and PCP recently stopped this.  Was having GERD symptoms several times a week, nausea without vomiting, early satiety, and dysphagia symptoms.  Also with postprandial diarrhea with about 4 BMs daily present for couple of years.  Consumes dairy products fairly regularly.  No alarm symptoms.  She did report history of colon cancer in [place] about 15 years ago with partial colon resection. Last colonoscopy with Dr. Earley Brooke in April 2018 with hemorrhoids, diverticulosis, and two hyperplastic polyps. Due for repeat in 2023.  Plans included trial of Dexilant, GERD diet, EGD +/-dilation, obtain CBC, CMP, lipase, TSH, celiac serologies, follow lactose-free diet.  Requested progress report regarding Dexilant.  If this works well, I will send in a prescription.  Labs 07/01/2019 with CBC remarkable for hemoglobin 11.3 with normocytic indices, CMP with albumin slightly low at 3.2 and calcium slightly low at 8.8, otherwise normal, lipase within normal limits.  TSH and celiac serologies not completed.  EGD 07/01/2019: Mucosal changes in the esophagus biopsied, otherwise esophagus appeared normal throughout s/p empiric dilation, nodular proximal gastric mucosa of uncertain significance biopsied.  Esophageal biopsy with chronic inflammation and stomach biopsy with  mild chronic gastritis without activity, H. pylori negative.  Hemoglobin in November 2020, 12.9.  Today: States she takes Dexilant 60 mg daily. Not sure how this is as I prescribed 2 weeks of samples. States she is getting it from the pharmacy. GERD symptoms seem to be doing ok. Continues to feel "stuffy" in the upper abdomen. Feels bloated. No significant pain. Present all the time but worse after meals. Has nausea without vomiting. Nausea is not triggered by meals. Admits to early satiety. Present for at least 6 months. Can only eat a small amount. Not eating many vegetables. Not many fruits. Eats fried chicken daily. Eating fast food daily. Has lost about 7 lbs in the last 2 months. Not trying to lose weight. Has had a couple days where her blood sugars were very high in the 300s and low 400 but she forgot to put the information into her pump. States her sugars are "all over the place."  Continues with dysphagia. Foods will stop at the sternal notch. When she drinks something, it will go down. Breads and meats cause more trouble. No improvement with dilation.   Was started on a prednisone pack by her doctor that sees her for sleep apnea. Not sure why. Something about inflammation. No longer taking this.   Diarrhea: BMs daily. Will have to have a BM after eating. Present for years. Stools tend to be loose and occasionally watery. 2-3 BMs daily. No nocturnal stools. Rare constipation. No blood in the stool. No black stools.   Has a cough and will be productive of phlegm. No known asthma. Occasional sinus drainage.  States her nose runs all the time.   No NSAIDs.   Past Medical History:  Diagnosis Date  . Adjustment disorder with depressed mood   . Dysphagia   . GERD (gastroesophageal reflux disease)   . History of breast cancer 2020  . Hyperlipidemia   . Malignant tumor of colon ([place])    About 15 years ago  . OSA (obstructive sleep apnea)    on CPAP  . 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 Type 2 diabetes mellitus with complications ([place])   . Vitamin D deficiency     Past Surgical History:  Procedure Laterality Date  . BIOPSY  07/01/2019   Procedure: BIOPSY;  Surgeon: Daneil Dolin, MD;  Location: [facility];  Service: Endoscopy;;  esophageal, gastric  . BREAST LUMPECTOMY Right 2020   x3  . COLON RESECTION     about 15 years ago; left-sided (per patient)  . COLONOSCOPY  07/28/2016   Dr. Earley Brooke; hemorrhoids, diverticulosis (few in the sigmoid colon), colon polyps (two-3 mm polyps biopsied from the rectum).  Pathology revealed hyperplastic polyp.  Next colonoscopy due in 2023.  Marland Kitchen ESOPHAGOGASTRODUODENOSCOPY  05/21/2016   Dr. Earley Brooke; hiatal hernia, polyps (2-3 mm inflammatory looking polyps in the antrum, biopsies taken).  Esophageal balloon dilation performed.  . ESOPHAGOGASTRODUODENOSCOPY N/A 07/01/2019   Procedure: ESOPHAGOGASTRODUODENOSCOPY (EGD);  Surgeon: Daneil Dolin, MD;  Location: [facility];  Service: Endoscopy;  Laterality: N/A;  2:45pm  . MALONEY DILATION N/A 07/01/2019   Procedure: Venia Minks DILATION;  Surgeon: Daneil Dolin, MD;  Location: [facility];  Service: Endoscopy;  Laterality: N/A;    Current Outpatient Medications  Medication Sig Dispense Refill  . aspirin EC 81 MG tablet Take 81 mg by mouth daily.    Marland Kitchen atorvastatin (LIPITOR) 80 MG tablet Take 40 mg by mouth daily.     . Cholecalciferol (VITAMIN D3) 50 MCG (2000 UT) capsule Take 2,000 Units by mouth daily.    . clopidogrel (PLAVIX) 75 MG tablet Take 75 mg by mouth daily.    Marland Kitchen dexlansoprazole (DEXILANT) 60 MG capsule Take 60 mg by mouth daily.    . diclofenac Sodium (VOLTAREN) 1 % GEL Apply 1 application topically daily as needed for pain.    . dorzolamide-timolol (COSOPT) 22.3-6.8 MG/ML ophthalmic solution Place 1 drop into both eyes 2 (two) times daily.    . Insulin Infusion Pump DEVI by Does not apply route. Basal rate 225 ( not sure) Novolog    .  letrozole (FEMARA) 2.5 MG tablet Take 2.5 mg by mouth daily.    Marland Kitchen losartan (COZAAR) 25 MG tablet Take 25 mg by mouth daily as needed (If blood pressure is over 140).     . sertraline (ZOLOFT) 100 MG tablet Take 150 mg by mouth daily.     . ondansetron (ZOFRAN) 4 MG tablet Take 1 tablet (4 mg total) by mouth every 8 (eight) hours as needed for nausea or vomiting. 30 tablet 0   No current facility-administered medications for this visit.    Allergies as of 08/05/2019 - Review Complete 08/05/2019  Allergen Reaction Noted  . Enoxaparin Other (See Comments) 06/15/2019  . Ivp dye [iodinated diagnostic agents] Other (See Comments) 06/15/2019  . Penicillins Rash 06/15/2019    Family History  Problem Relation Age of Onset  . Colon cancer Neg Hx     Social History   Socioeconomic History  . Marital status: Single    Spouse  name: Not on file  . Number of children: Not on file  . Years of education: Not on file  . Highest education level: Not on file  Occupational History  . Not on file  Tobacco Use  . Smoking status: Former Research scientist (life sciences)  . Smokeless tobacco: Never Used  . Tobacco comment: Never did smoke much just one or two daily  Substance and Sexual Activity  . Alcohol use: Not Currently  . Drug use: Never  . Sexual activity: Not on file  Other Topics Concern  . Not on file  Social History Narrative  . Not on file   Social Determinants of Health   Financial Resource Strain:   . Difficulty of Paying Living Expenses:   Food Insecurity:   . Worried About Charity fundraiser in the Last Year:   . Arboriculturist in the Last Year:   Transportation Needs:   . Film/video editor (Medical):   Marland Kitchen Lack of Transportation (Non-Medical):   Physical Activity:   . Days of Exercise per Week:   . Minutes of Exercise per Session:   Stress:   . Feeling of Stress :   Social Connections:   . Frequency of Communication with Friends and Family:   . Frequency of Social Gatherings with  Friends and Family:   . Attends Religious Services:   . Active Member of Clubs or Organizations:   . Attends Archivist Meetings:   Marland Kitchen Marital Status:     Review of Systems: Gen: Denies fever, chills, cold or flulike symptoms.  Admits to lightheadedness with position changes. CV: Denies chest pain palpitations. Resp: Denies dyspnea at rest. GI: See HPI Derm: Denies rash Heme: See HPI  Physical Exam: BP (!) 152/82   Pulse (!) 110   Temp (!) 97.2 F (36.2 C) (Oral)   Ht 5\' 2"  (1.575 m)   Wt 191 lb 9.6 oz (86.9 kg)   BMI 35.04 kg/m  General:   Alert and oriented. No distress noted. Pleasant and cooperative.  Head:  Normocephalic and atraumatic. Eyes:  Conjuctiva clear without scleral icterus. Heart:  S1, S2 present without murmurs appreciated.  Tachycardia have resolved after rest.  Heart rate in the 80s. Lungs:  Clear to auscultation bilaterally. No wheezes, rales, or rhonchi. No distress.  Abdomen:  +BS, soft, and non-distended.  Mild tenderness to palpation in the RUQ.  Patient reports pressure when palpating epigastric area. No rebound or guarding. No HSM or masses noted. Msk:  Symmetrical without gross deformities. Normal posture. Extremities:  Without edema. Neurologic:  Alert and  oriented x4 Psych:  Alert and cooperative. Normal mood and affect.  Total Time Spent: 45 min

## 2019-08-05 ENCOUNTER — Encounter: Payer: Self-pay | Admitting: Gastroenterology

## 2019-08-05 ENCOUNTER — Ambulatory Visit (INDEPENDENT_AMBULATORY_CARE_PROVIDER_SITE_OTHER): Payer: Medicare Other | Admitting: Gastroenterology

## 2019-08-05 ENCOUNTER — Other Ambulatory Visit: Payer: Self-pay

## 2019-08-05 VITALS — BP 152/82 | HR 110 | Temp 97.2°F | Ht 62.0 in | Wt 191.6 lb

## 2019-08-05 DIAGNOSIS — K219 Gastro-esophageal reflux disease without esophagitis: Secondary | ICD-10-CM

## 2019-08-05 DIAGNOSIS — R6881 Early satiety: Secondary | ICD-10-CM | POA: Diagnosis not present

## 2019-08-05 DIAGNOSIS — R197 Diarrhea, unspecified: Secondary | ICD-10-CM | POA: Diagnosis not present

## 2019-08-05 DIAGNOSIS — R1310 Dysphagia, unspecified: Secondary | ICD-10-CM

## 2019-08-05 DIAGNOSIS — R140 Abdominal distension (gaseous): Secondary | ICD-10-CM | POA: Diagnosis not present

## 2019-08-05 DIAGNOSIS — R110 Nausea: Secondary | ICD-10-CM

## 2019-08-05 DIAGNOSIS — D649 Anemia, unspecified: Secondary | ICD-10-CM

## 2019-08-05 DIAGNOSIS — R6889 Other general symptoms and signs: Secondary | ICD-10-CM

## 2019-08-05 MED ORDER — ONDANSETRON HCL 4 MG PO TABS: 4.0000 mg | ORAL_TABLET | Freq: Three times a day (TID) | ORAL | 0 refills | Status: DC | PRN

## 2019-08-05 NOTE — Assessment & Plan Note (Addendum)
GERD symptoms are improved with Dexilant daily.  She continues with bloating/fullness in the upper abdomen, intermittent nausea without vomiting, early satiety as discussed below.  EGD on file from 07/01/2019 with mucosal changes in the esophagus biopsied, otherwise esophagus appeared normal throughout s/p empiric dilation, nodular proximal gastric mucosa of uncertain significance biopsied.  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 pylori negative.  Continue Dexilant 60 mg daily. Advised to avoid fried, fatty, greasy, foods, soda, carbonated beverages. GERD handout provided.  Follow-up in 3 months.

## 2019-08-05 NOTE — Assessment & Plan Note (Addendum)
Patient reports chronic (not present for a few years) history of loose/watery postprandial BMs.  Currently with 2-3 BMs daily.  Denies nocturnal symptoms, bright red blood per rectum, or melena.  She has had 7 pound weight loss in the last 2 months which has been unintentional, and I suspect this is secondary to upper GI issues as discussed above.  She does have history of colon cancer about 15 years s/p partial colectomy in [place]. Last colonoscopy in April 2018 with hemorrhoids, diverticulosis, 2 hyperplastic polyps and due for repeat in 2023.  I have ordered celiac serologies and TSH at her last visit but these were not completed.  Most recent labs on file from March 2021 with hemoglobin 11.3 (L).  This is down from hemoglobin 12.9 in November 2020.   Suspect dietary habits are playing a role as she reports eating fried chicken and fast foods daily.  May also have diabetic enteropathy as she reports her glucose is not well controlled.  Differentials also include celiac disease and thyroid abnormalities.  Considering anemia, IBD is less likely but is on the differential.  Check celiac serologies (IgA total, TTG IgA) TSH Update CBC and check iron panel.  Follow a low fat diet. Avoid fried, fatty, and greasy foods.  Follow-up in 3 months.  Of note, if hemoglobin continues to trend down or she has evidence of iron deficiency, will need to consider early interval colonoscopy.

## 2019-08-05 NOTE — Assessment & Plan Note (Signed)
Continues to have dysphagia symptoms with sensation of food stopping at the sternal notch and going down after drinking something.  EGD on 07/01/2019 with mucosal changes in the esophagus biopsied revealing chronic inflammation, otherwise normal-appearing esophagus s/p empiric dilation.  Patient denies any improvement in dysphagia with dilation.    Query whether she has esophageal motility disorder.  We will pursue BPE for further evaluation.

## 2019-08-05 NOTE — Assessment & Plan Note (Signed)
Addressed under bloating.

## 2019-08-05 NOTE — Patient Instructions (Addendum)
Please have labs completed at [facility].  We will help you arrange the following studies: Gastric emptying study to help evaluate your early satiety, nausea without vomiting, and bloating. Right upper quadrant ultrasound to evaluate for any gallbladder problems contributing to your bloating, early satiety, nausea without vomiting. Barium pill esophagram which is AN x-ray study to look at your esophagus to evaluate your trouble swallowing.  Continue taking Dexilant 60 mg daily.  Please let me know if you do not have a prescription for this.  I am sending in Zofran 4 mg.  You may take this every 8 hours as needed for nausea.  Please follow a low-fat diet.  Avoid all fried, fatty, greasy foods.   Avoid foods that cause gas and bloating including broccoli, cabbage, cauliflower, beans, artificial sweetener, soda, carbonated beverages, chewing gum, and drinking through a straw.  Try eating 4-6 small meals daily rather than 3 large meals.  We will see you back in 3 months.  Call with questions or concerns prior.  [name], PA-C [facility]   Abdominal Bloating When you have abdominal bloating, your abdomen may feel full, tight, or painful. It may also look bigger than normal or swollen (distended). Common causes of abdominal bloating include:  Swallowing air.  Constipation.  Problems digesting food.  Eating too much.  Irritable bowel syndrome. This is a condition that affects the large intestine.  Lactose intolerance. This is an inability to digest lactose, a natural sugar in dairy products.  Celiac disease. This is a condition that affects the ability to digest gluten, a protein found in some grains.  Gastroparesis. This is a condition that slows down the movement of food in the stomach and small intestine. It is more common in people with diabetes mellitus.  Gastroesophageal reflux disease (GERD). This is a digestive condition that makes stomach acid flow back  into the esophagus.  Urinary retention. This means that the body is holding onto urine, and the bladder cannot be emptied all the way. Follow these instructions at home: Eating and drinking  Avoid eating too much.  Try not to swallow air while talking or eating.  Avoid eating while lying down.  Avoid these foods and drinks: ? Foods that cause gas, such as broccoli, cabbage, cauliflower, and baked beans. ? Carbonated drinks. ? Hard candy. ? Chewing gum. Medicines  Take over-the-counter and prescription medicines only as told by your health care provider.  Take probiotic medicines. These medicines contain live bacteria or yeasts that can help digestion.  Take coated peppermint oil capsules. Activity  Try to exercise regularly. Exercise may help to relieve bloating that is caused by gas and relieve constipation. General instructions  Keep all follow-up visits as told by your health care provider. This is important. Contact a health care provider if:  You have nausea and vomiting.  You have diarrhea.  You have abdominal pain.  You have unusual weight loss or weight gain.  You have severe pain, and medicines do not help. Get help right away if:  You have severe chest pain.  You have trouble breathing.  You have shortness of breath.  You have trouble urinating.  You have darker urine than normal.  You have blood in your stools or have dark, tarry stools. Summary  Abdominal bloating means that the abdomen is swollen.  Common causes of abdominal bloating are swallowing air, constipation, and problems digesting food.  Avoid eating too much and avoid swallowing air.  Avoid foods that cause gas, carbonated  drinks, hard candy, and chewing gum. This information is not intended to replace advice given to you by your health care provider. Make sure you discuss any questions you have with your health care provider. Document Revised: 07/26/2018 Document Reviewed:  05/09/2016 Elsevier Patient Education  2020 [place] for Gastroesophageal Reflux Disease, Adult When you have gastroesophageal reflux disease (GERD), the foods you eat and your eating habits are very important. Choosing the right foods can help ease the discomfort of GERD. Consider working with a diet and nutrition specialist (dietitian) to help you make healthy food choices. What general guidelines should I follow?  Eating plan  Choose healthy foods low in fat, such as fruits, vegetables, whole grains, low-fat dairy products, and lean meat, fish, and poultry.  Eat frequent, small meals instead of three large meals each day. Eat your meals slowly, in a relaxed setting. Avoid bending over or lying down until 2-3 hours after eating.  Limit high-fat foods such as fatty meats or fried foods.  Limit your intake of oils, butter, and shortening to less than 8 teaspoons each day.  Avoid the following: ? Foods that cause symptoms. These may be different for different people. Keep a food diary to keep track of foods that cause symptoms. ? Alcohol. ? Drinking large amounts of liquid with meals. ? Eating meals during the 2-3 hours before bed.  Cook foods using methods other than frying. This may include baking, grilling, or broiling. Lifestyle  Maintain a healthy weight. Ask your health care provider what weight is healthy for you. If you need to lose weight, work with your health care provider to do so safely.  Exercise for at least 30 minutes on 5 or more days each week, or as told by your health care provider.  Avoid wearing clothes that fit tightly around your waist and chest.  Do not use any products that contain nicotine or tobacco, such as cigarettes and e-cigarettes. If you need help quitting, ask your health care provider.  Sleep with the head of your bed raised. Use a wedge under the mattress or blocks under the bed frame to raise the head of the bed. What foods  are not recommended? The items listed may not be a complete list. Talk with your dietitian about what dietary choices are best for you. Grains Pastries or quick breads with added fat. [place] toast. Vegetables Deep fried vegetables. [place] fries. Any vegetables prepared with added fat. Any vegetables that cause symptoms. For some people this may include tomatoes and tomato products, chili peppers, onions and garlic, and horseradish. Fruits Any fruits prepared with added fat. Any fruits that cause symptoms. For some people this may include citrus fruits, such as oranges, grapefruit, pineapple, and lemons. Meats and other protein foods High-fat meats, such as fatty beef or pork, hot dogs, ribs, ham, sausage, salami and bacon. Fried meat or protein, including fried fish and fried chicken. Nuts and nut butters. Dairy Whole milk and chocolate milk. Sour cream. Cream. Ice cream. Cream cheese. Milk shakes. Beverages Coffee and tea, with or without caffeine. Carbonated beverages. Sodas. Energy drinks. Fruit juice made with acidic fruits (such as orange or grapefruit). Tomato juice. Alcoholic drinks. Fats and oils Butter. Margarine. Shortening. Ghee. Sweets and desserts Chocolate and cocoa. Donuts. Seasoning and other foods Pepper. Peppermint and spearmint. Any condiments, herbs, or seasonings that cause symptoms. For some people, this may include curry, hot sauce, or vinegar-based salad dressings. Summary  When you have gastroesophageal reflux  disease (GERD), food and lifestyle choices are very important to help ease the discomfort of GERD.  Eat frequent, small meals instead of three large meals each day. Eat your meals slowly, in a relaxed setting. Avoid bending over or lying down until 2-3 hours after eating.  Limit high-fat foods such as fatty meat or fried foods. This information is not intended to replace advice given to you by your health care provider. Make sure you discuss any questions you  have with your health care provider. Document Revised: 07/29/2018 Document Reviewed: 04/08/2016 Elsevier Patient Education  [employer].

## 2019-08-05 NOTE — Assessment & Plan Note (Addendum)
Patient continues with bloating/fullness in the upper abdomen without any significant pain, nausea without vomiting, and early satiety present for the last 6 months.  Symptoms are present most the time but to worsen after meals.  Admits to eating fried chicken and fast food daily.  Glucose is not well controlled when she reports her sugars being "all over the place". BMs daily but are postprandial and loose. Weight loss of 7 pounds in the last 2 months.  Recent EGD 07/01/2019 with mucosal changes in the esophagus s/p biopsy, nodular proximal gastric mucosa of uncertain significance s/p biopsy.  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 pylori negative.  Exam with mild tenderness to palpation in the right upper quadrant.  Differentials include gallbladder/biliary etiology, gastroparesis secondary to uncontrolled diabetes, celiac disease.  Dietary habits are likely playing a role.  Gastric emptying study RUQ ultrasound Celiac Serologies (IgA total and TTG IgA) Follow a low-fat diet.  Avoid all fried, fatty, greasy foods.  Avoid foods that cause gas and bloating including broccoli, cabbage, cauliflower, beans, artificial sweetener, soda, carbonated beverages, chewing gum, and drinking through a straw.  Handout provided. Try eating 4-6 small meals daily rather than 3 large meals. Zofran 4 mg every 8 hours as needed for nausea. Follow-up in 3 months.

## 2019-08-05 NOTE — Assessment & Plan Note (Signed)
Mild anemia with hemoglobin 11.3 and normocytic indices noted on recent labs completed 07/01/2019.  Denies bright red blood per rectum or melena.  Hemoglobin was 12.28 February 2019.  Recent EGD 07/01/2019 with mucosal changes in the esophagus biopsied, otherwise esophagus appeared normal throughout s/p empiric dilation, nodular proximal gastric mucosa of uncertain significance biopsied.  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 pylori negative.  Last colonoscopy April 2018 with hemorrhoids, diverticulosis, 2 hyperplastic polyps due for repeat in 2023.  She does have 3 of colon cancer about 15 years ago s/p partial colonic resection.  Plan to update CBC, and check iron panel, B12, folate as well as celiac serologies in light of diarrhea and bloating.  Further recommendations to follow.  Should her hemoglobin continued to trend down or if she has iron deficiency, would consider early interval colonoscopy.

## 2019-08-11 ENCOUNTER — Ambulatory Visit (HOSPITAL_COMMUNITY)
Admission: RE | Admit: 2019-08-11 | Discharge: 2019-08-11 | Disposition: A | Discharge status: Home / Self Care | Payer: Medicare Other | Source: Ambulatory Visit | Attending: Gastroenterology | Admitting: Gastroenterology

## 2019-08-11 ENCOUNTER — Other Ambulatory Visit: Payer: Self-pay

## 2019-08-11 DIAGNOSIS — R110 Nausea: Secondary | ICD-10-CM | POA: Insufficient documentation

## 2019-08-11 DIAGNOSIS — R1310 Dysphagia, unspecified: Secondary | ICD-10-CM | POA: Diagnosis present

## 2019-08-11 DIAGNOSIS — R140 Abdominal distension (gaseous): Secondary | ICD-10-CM | POA: Insufficient documentation

## 2019-08-11 IMAGING — US US ABDOMEN LIMITED
1 series · 14 of 25 positions shown · non-contrast
Comparison: None.

CLINICAL DATA: Chronic right upper quadrant abdominal pain.

EXAM:
ULTRASOUND ABDOMEN LIMITED RIGHT UPPER QUADRANT

[Series 1: us abdomen limited ruq · 14 of 54 slices shown]
[im 1/54]
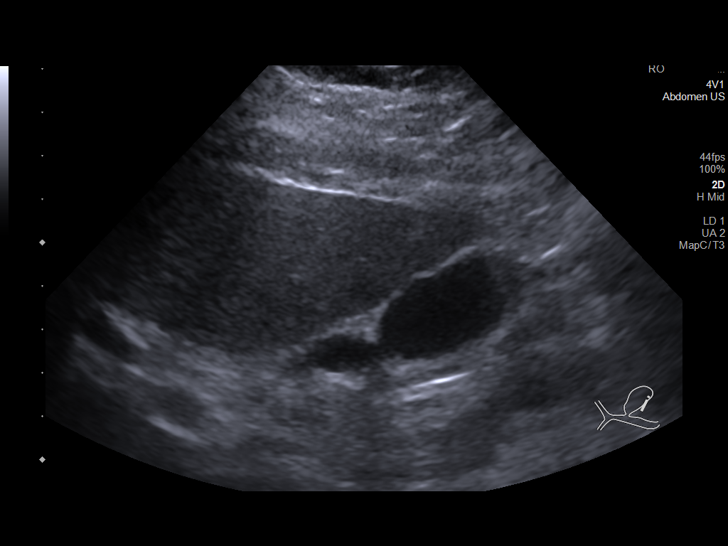
[im 5/54]
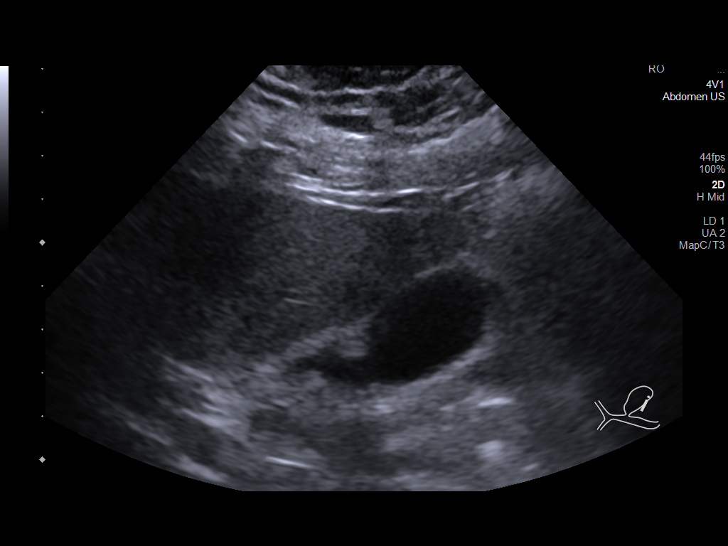
[im 9/54]
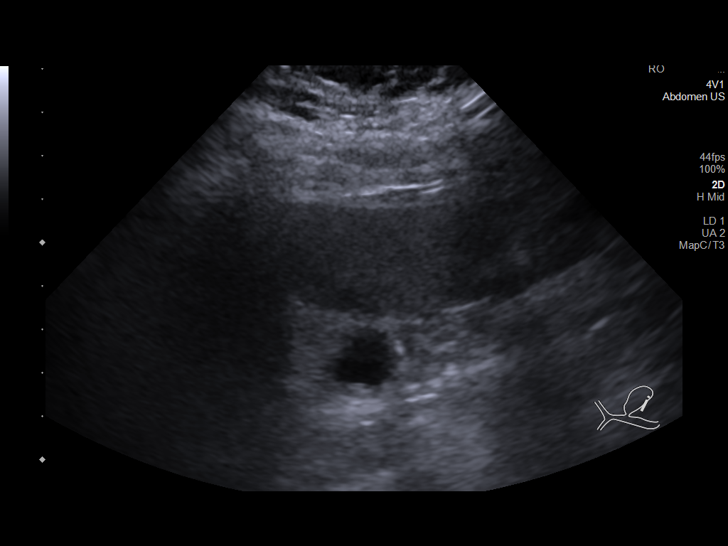
[im 14/54]
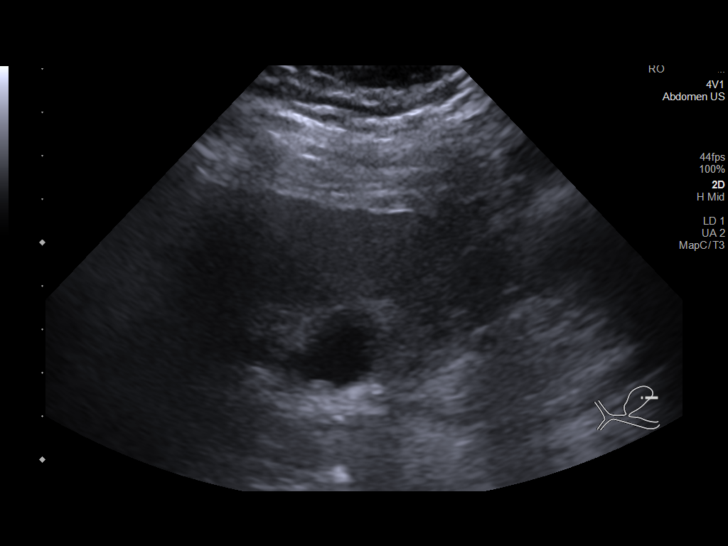
[im 18/54]
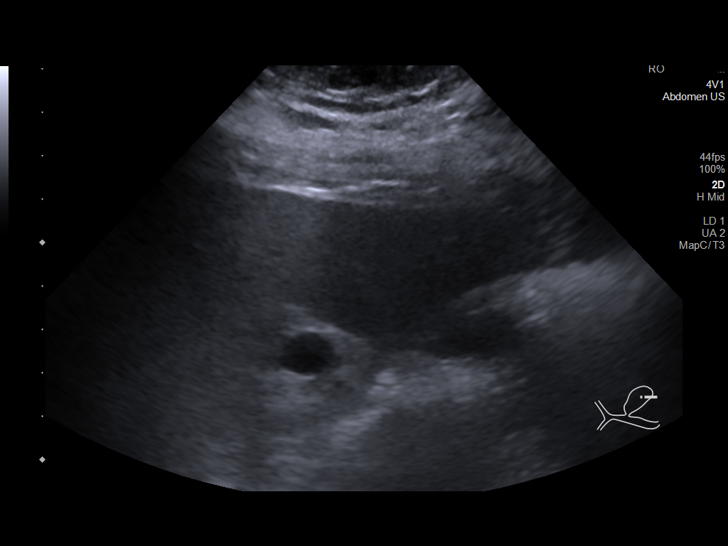
[im 20/54]
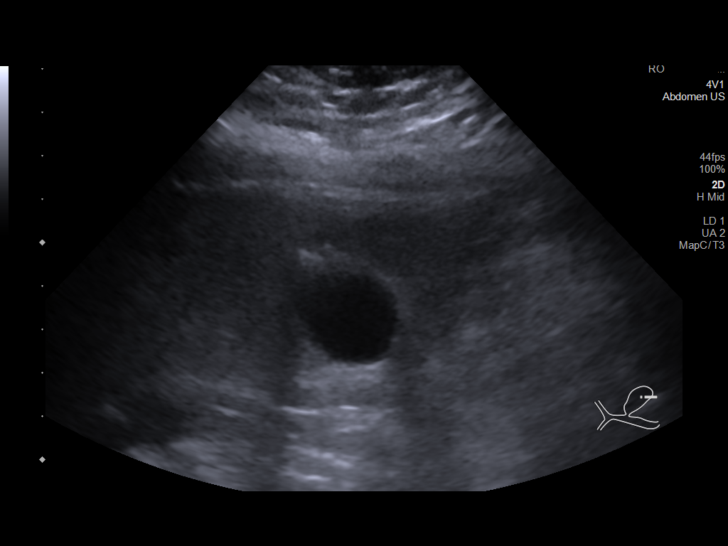
[im 25/54]
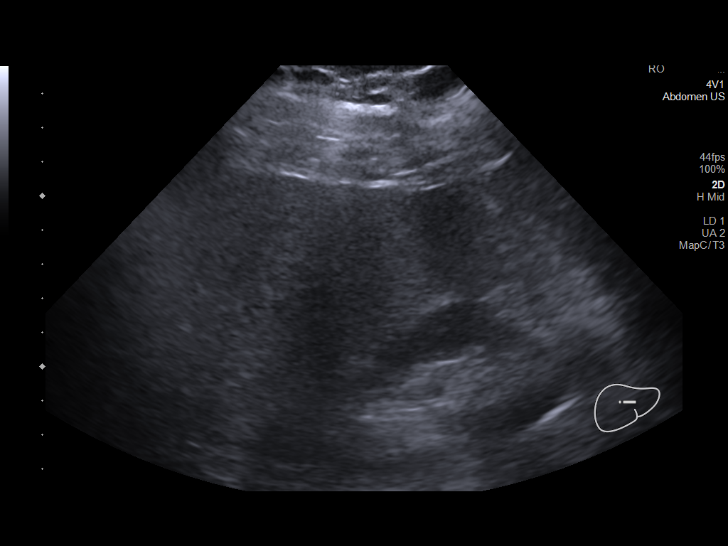
[im 29/54]
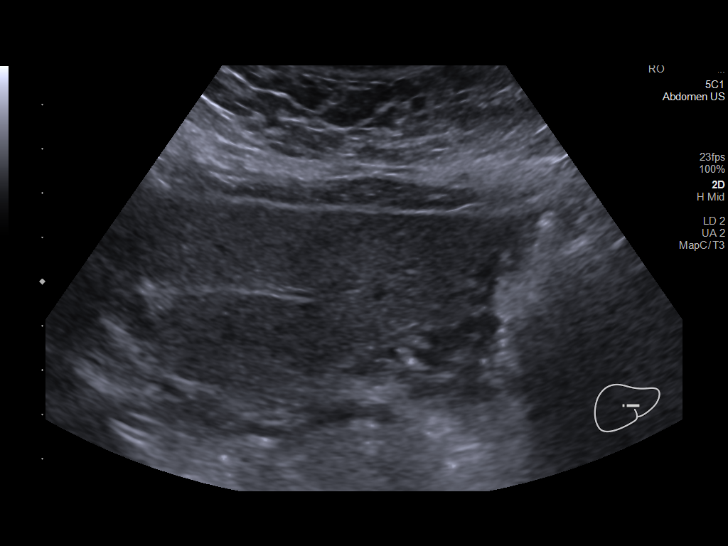
[im 34/54]
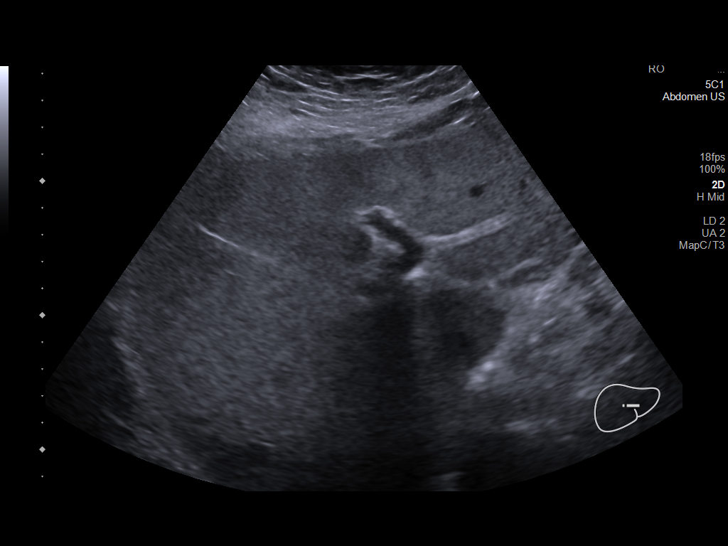
[im 36/54]
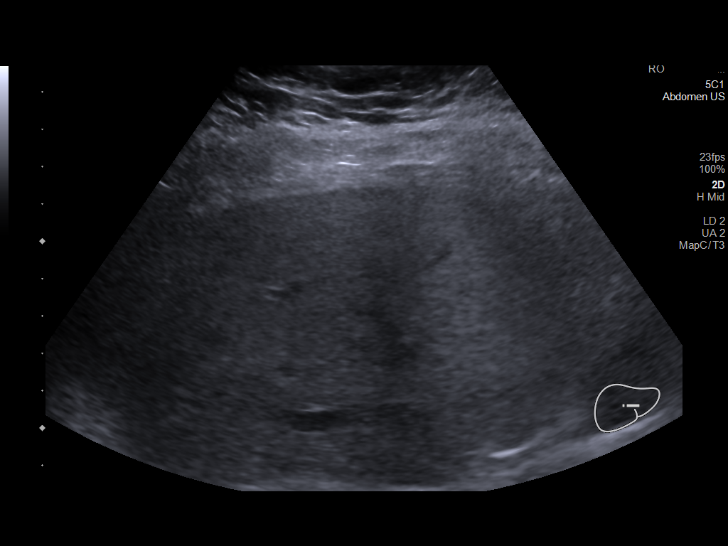
[im 40/54]
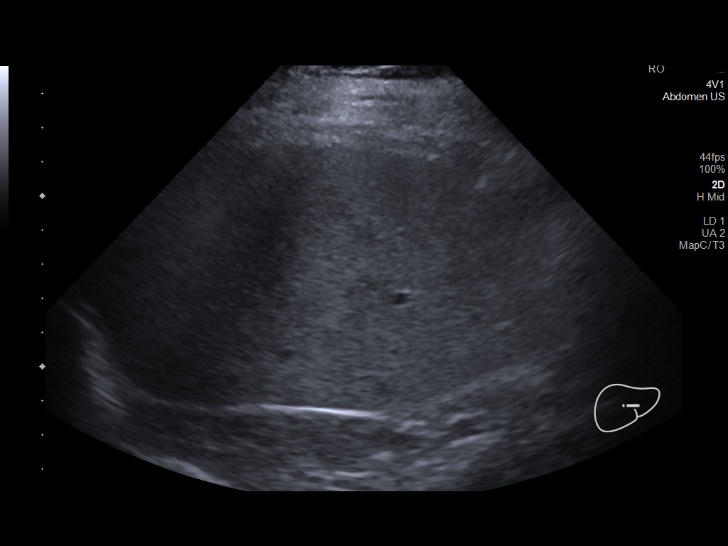
[im 45/54]
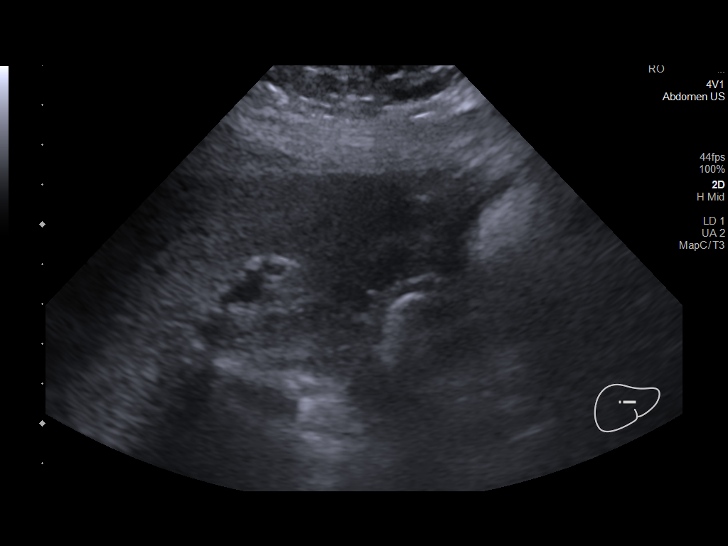
[im 49/54]
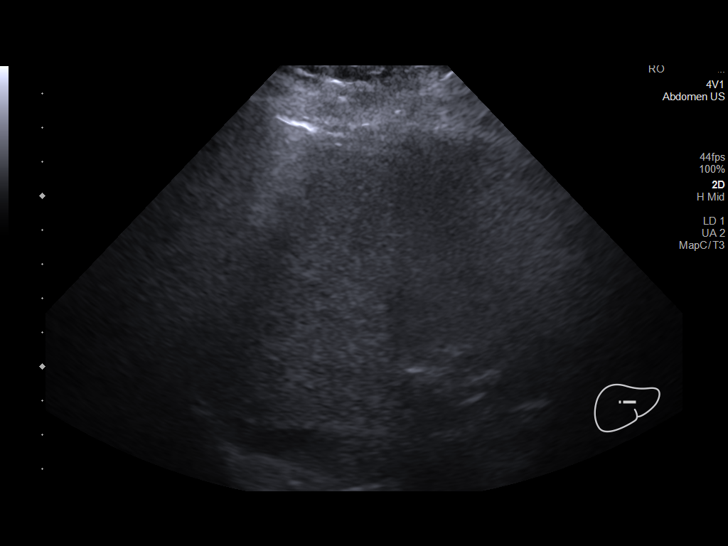
[im 54/54]
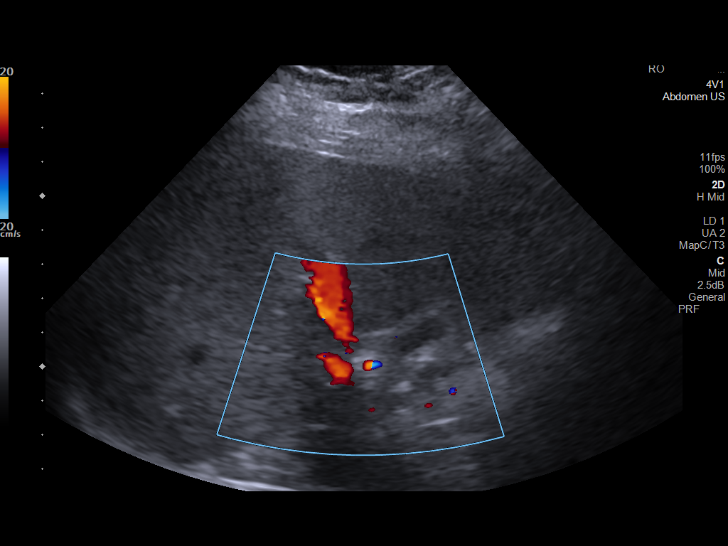

[14 of 25 positions shown; findings below may reference images not displayed]

FINDINGS: Gallbladder:

No gallstones or wall thickening visualized. No sonographic Murphy
sign noted by sonographer.

Common bile duct:

Diameter: 4 mm which is within normal limits.

Liver:

No focal lesion identified. Increased echogenicity hepatic
parenchyma is noted suggesting hepatic steatosis or other diffuse
hepatocellular disease. Portal vein is patent on color Doppler
imaging with normal direction of blood flow towards the liver.

Other: None.
IMPRESSION: Increased echogenicity of hepatic parenchyma is noted suggesting
hepatic steatosis or other diffuse hepatocellular disease. No other
abnormality seen in the right upper quadrant of the abdomen.

## 2019-08-11 IMAGING — RF DG ESOPHAGUS
4 series · 15 of 16 positions shown · non-contrast
Comparison: None.

CLINICAL DATA: Dysphagia.

EXAM:
ESOPHOGRAM / BARIUM SWALLOW / BARIUM TABLET STUDY
TECHNIQUE: Combined double contrast and single contrast examination performed
using effervescent crystals, thick barium liquid, and thin barium
liquid. The patient was observed with fluoroscopy swallowing a 13 mm
barium sulphate tablet.
FLUOROSCOPY TIME:  Radiation Exposure Index (if provided by the
fluoroscopic device): 16.7 mGy.

[Series 1: cp_standard · 0.27mm/px · 4 of 98 frames shown (1 of 4)]
[frame 15/98]
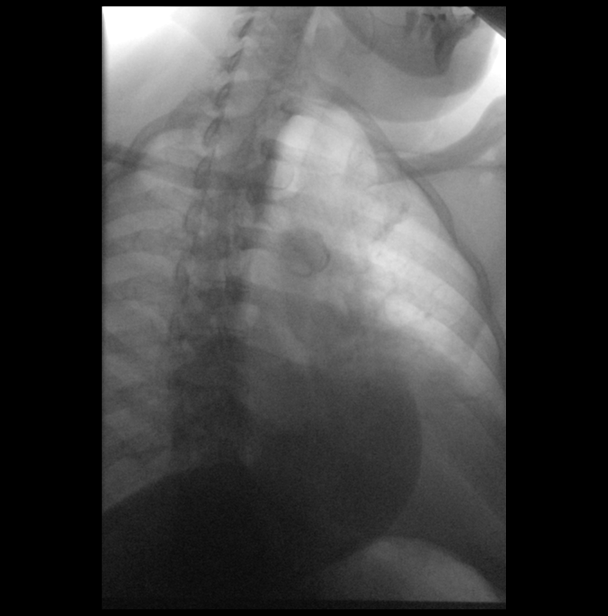
[frame 19/98]
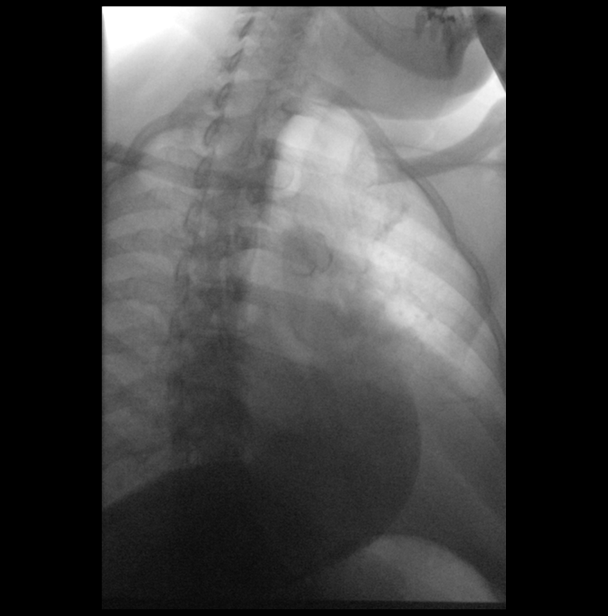
[frame 50/98]
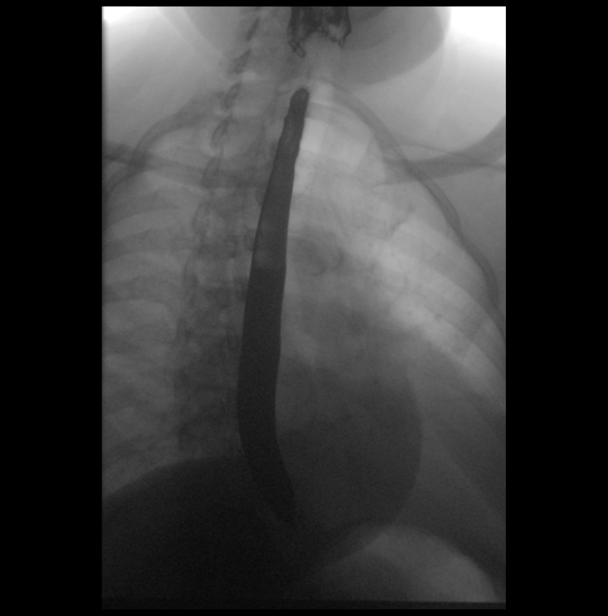
[frame 84/98]
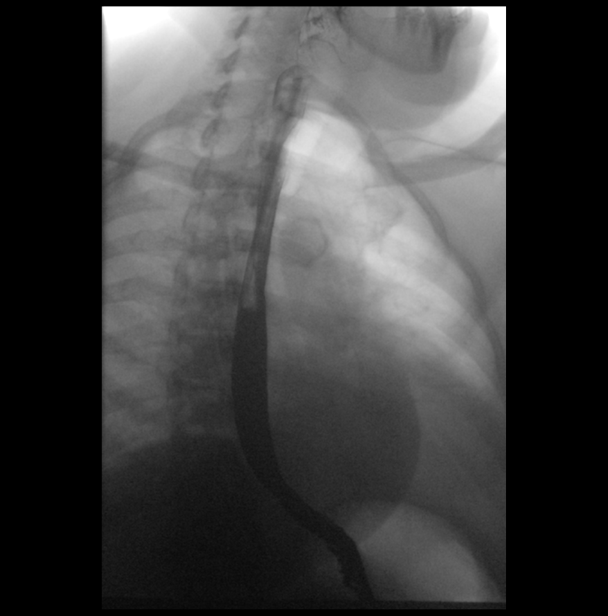

[Series 2: cp_standard · 0.27mm/px · 3 of 133 frames shown (2 of 4)]
[frame 19/133]
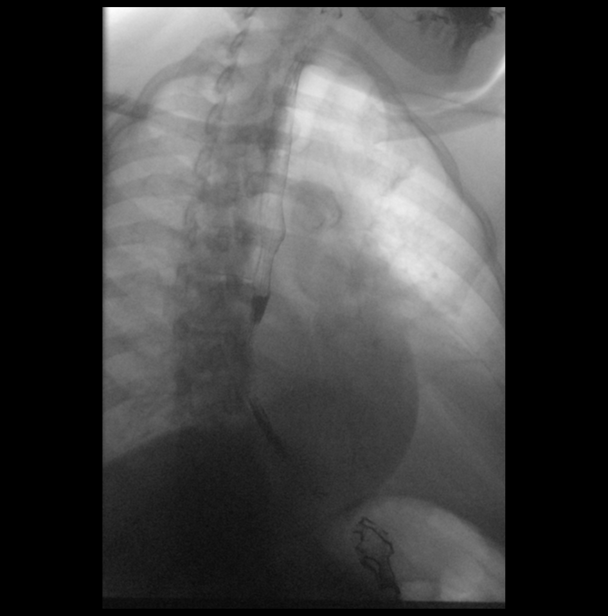
[frame 20/133]
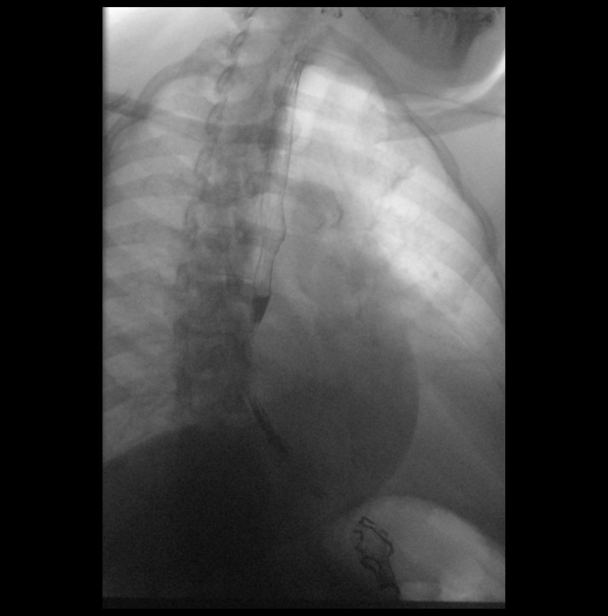
[frame 67/133]
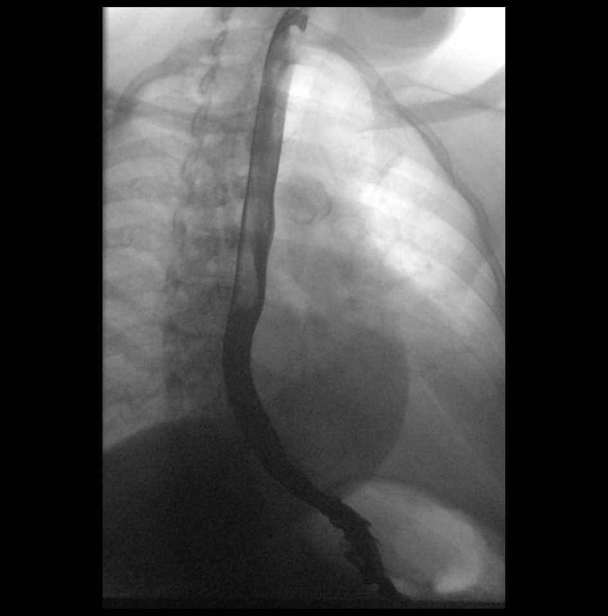

[Series 3: cp_standard · 0.27mm/px · 4 of 136 frames shown (3 of 4)]
[frame 21/136]
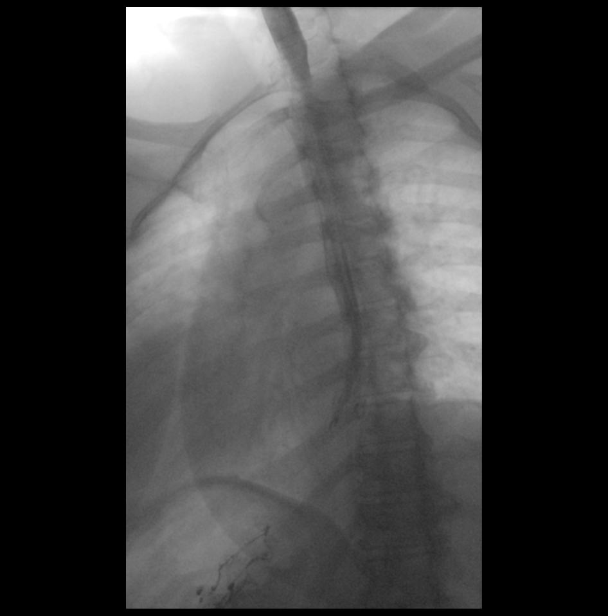
[frame 69/136]
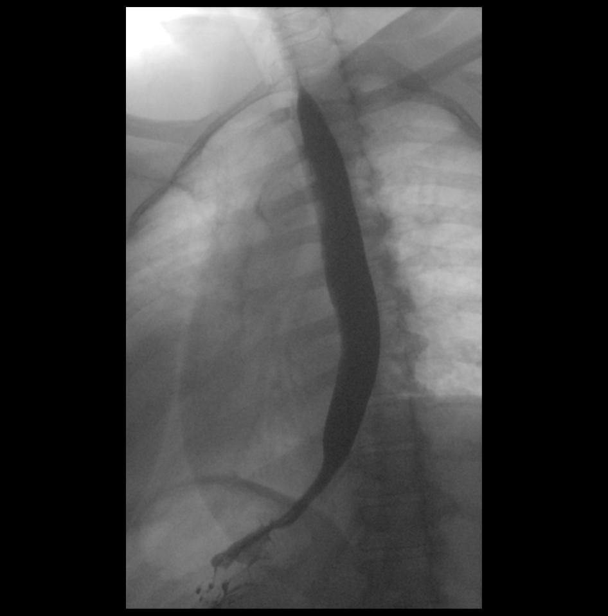
[frame 116/136]
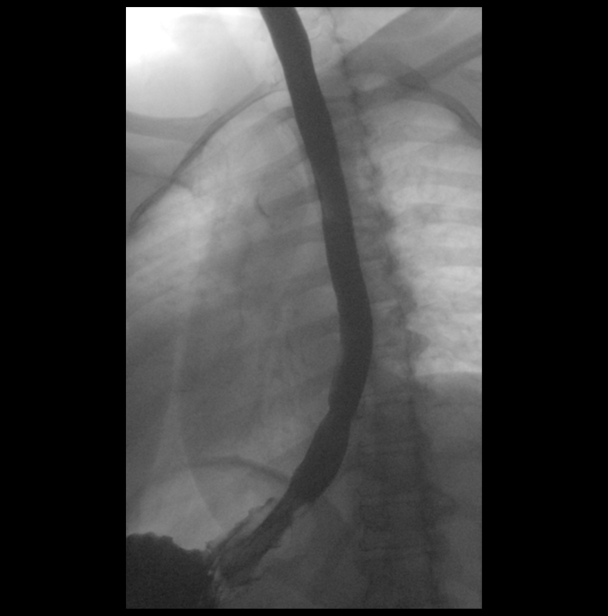
[frame 125/136]
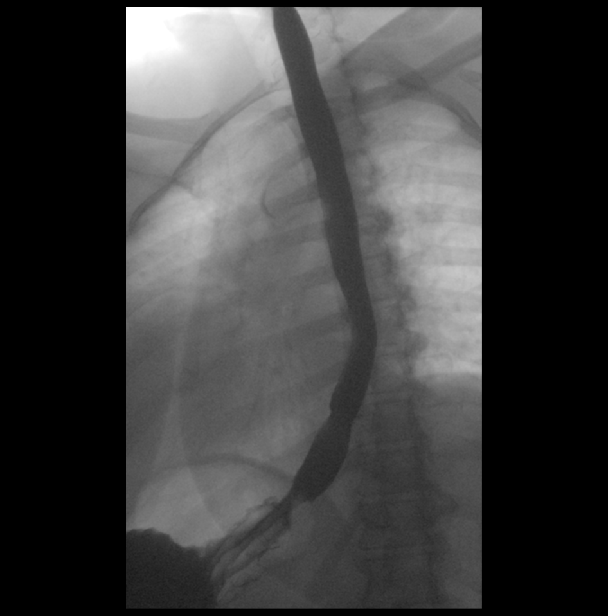

[Series 4: cp_standard · 0.27mm/px · 4 of 190 frames shown (4 of 4)]
[frame 24/190]
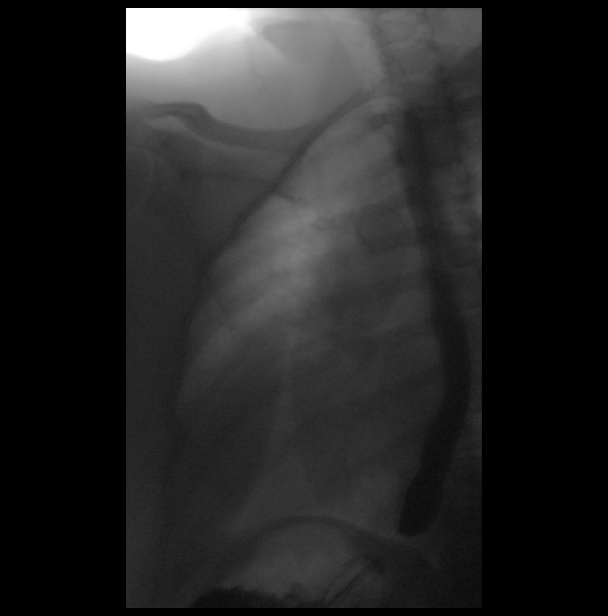
[frame 29/190]
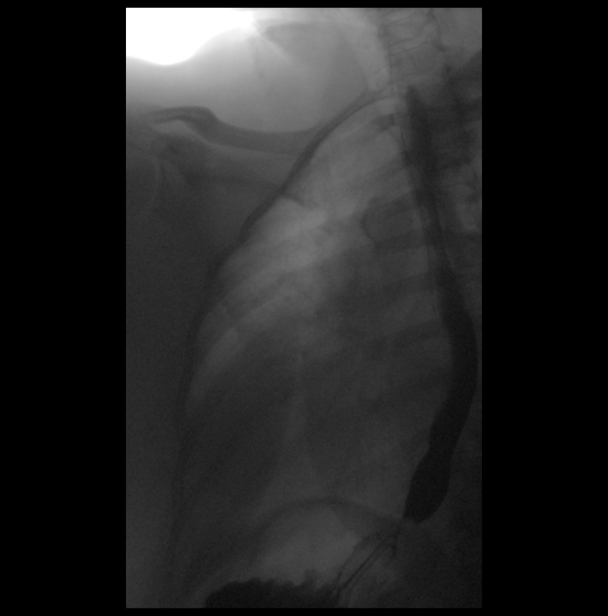
[frame 96/190]
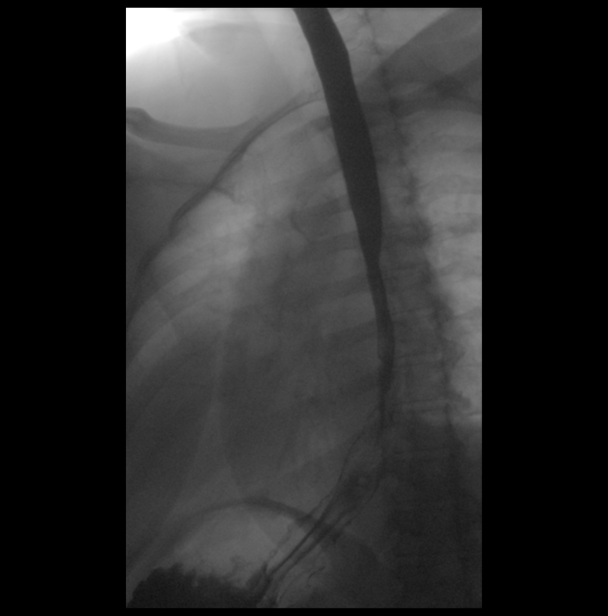
[frame 162/190]
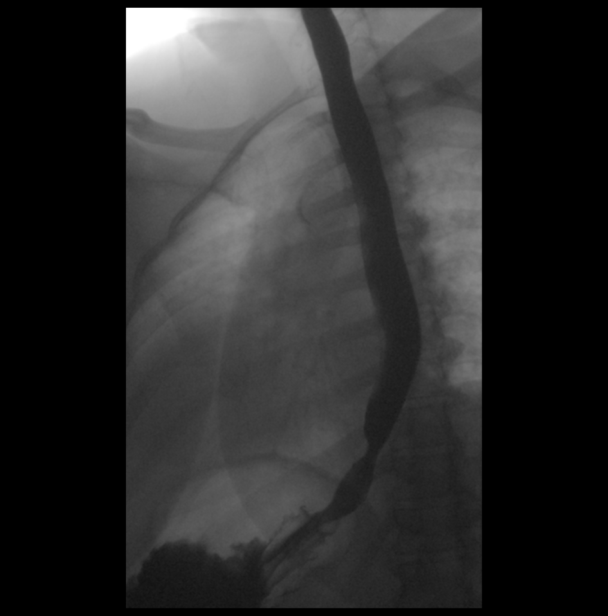

[15 of 16 positions shown; findings below may reference images not displayed]

FINDINGS: No mass or stricture is noted in the esophagus. No hiatal hernia or
reflux is noted. Barium tablet passed through esophagus and into
stomach without difficulty or delay.
IMPRESSION: No abnormality seen in the esophagus.

## 2019-08-11 NOTE — Progress Notes (Signed)
BPE is normal. No esophageal abnormality or motility disorder noted.   Recommendations for dysphagia:  All meats should be chopped finely.  Eat slowly. Take small bites. Chew thoroughly.  Drink fluids before starting to eat and plenty of fluids during meals.   Follow a GERD diet and other recommendations made at her last visit.

## 2019-08-11 NOTE — Progress Notes (Signed)
RUQ [place] doesn't reveal any explanation of her upper abdominal bloating/nausea/early satiety. No gallstones or signs of inflammation. She does have a fatty liver.   Instructions for fatty liver: Recommend 1-2# weight loss per week until ideal body weight through exercise & diet. Low fat/cholesterol diet.  Avoid sweets, sodas, fruit juices, sweetened beverages like tea, etc. Gradually increase exercise from 15 min daily up to 1 hr per day 5 days/week. Limit alcohol use. Additionally, she needs to work on gaining better control of her diabetes as this also plays a role in fatty liver.   *Please mail fatty liver handout.   Waiting on [facility]ES scheduled 4/27. Additionally, I had ordered labs at her last OV. Please remind her to have these completed.

## 2019-08-12 NOTE — Progress Notes (Signed)
Called verified name and dob notified pt of results, apt time *am at [facility] on 4/27, handout for fatty liver mailed to patient. Pt stated she would have test done and that she understood and thanked me for the call

## 2019-08-16 ENCOUNTER — Other Ambulatory Visit: Payer: Self-pay

## 2019-08-16 ENCOUNTER — Encounter (HOSPITAL_COMMUNITY)
Admission: RE | Admit: 2019-08-16 | Discharge: 2019-08-16 | Disposition: A | Discharge status: Home / Self Care | Payer: Medicare Other | Source: Ambulatory Visit | Attending: Gastroenterology | Admitting: Gastroenterology

## 2019-08-16 DIAGNOSIS — R110 Nausea: Secondary | ICD-10-CM | POA: Insufficient documentation

## 2019-08-16 DIAGNOSIS — R6881 Early satiety: Secondary | ICD-10-CM | POA: Insufficient documentation

## 2019-08-16 DIAGNOSIS — R140 Abdominal distension (gaseous): Secondary | ICD-10-CM | POA: Insufficient documentation

## 2019-08-16 IMAGING — NM NM GASTRIC EMPTYING
8 series · 8 of 8 positions shown · non-contrast
Comparison: None.

CLINICAL DATA: Early satiety with abdominal bloating and nausea

EXAM:
NUCLEAR MEDICINE GASTRIC EMPTYING SCAN
TECHNIQUE: After oral ingestion of radiolabeled meal, sequential abdominal
images were obtained for 3 hours. Percentage of activity emptying
the stomach was calculated at 1 hour, 2 hours, and 3 hours post
radiotracer administration.
RADIOPHARMACEUTICALS:  2.1 mCi [Y6] sulfur colloid in standardized
meal including egg

[Series 1: 0 min · 4.14mm/px · 1 of 1 slices shown (1 of 2)]
[im 1/1]
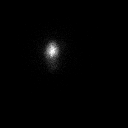

[Series 1: 0 min · 4.14mm/px · 1 of 1 slices shown (2 of 2)]
[im 1/1]
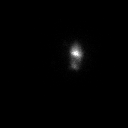

[Series 2: 60 min · 4.14mm/px · 1 of 1 slices shown (1 of 2)]
[im 1/1]
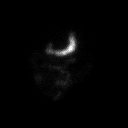

[Series 2: 60 min · 4.14mm/px · 1 of 1 slices shown (2 of 2)]
[im 1/1  full-range]
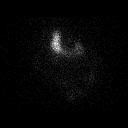

[Series 3: 120 min · 4.14mm/px · 1 of 1 slices shown (1 of 2)]
[im 1/1]
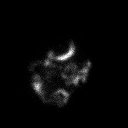

[Series 3: 120 min · 4.14mm/px · 1 of 1 slices shown (2 of 2)]
[im 1/1  full-range]
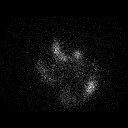

[Series 4: 180 min · 4.14mm/px · 1 of 1 slices shown (1 of 2)]
[im 1/1]
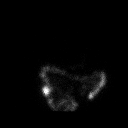

[Series 4: 180 min · 4.14mm/px · 1 of 1 slices shown (2 of 2)]
[im 1/1  full-range]
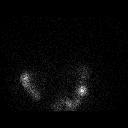

[8 of 8 positions shown; findings below may reference images not displayed]

FINDINGS: Expected location of the stomach in the left upper quadrant.
Ingested meal empties the stomach gradually over the course of the
study.

33.9% emptied at 1 hr ( normal >= 10%)

70.9% emptied at 2 hr ( normal >= 40%)

94.5% emptied at 3 hr ( normal >= 70%)

Normal emptying of greater than 90% 4 hours post radiotracer
administration.
IMPRESSION: Normal gastric emptying study.

## 2019-08-16 MED ORDER — TECHNETIUM TC 99M SULFUR COLLOID
2.0000 | Freq: Once | INTRAVENOUS | Status: AC | PRN
  Administered 2019-08-16: 2.1 via INTRAVENOUS

## 2019-08-16 NOTE — Progress Notes (Signed)
GES within normal limits. No delay in gastric emptying. No explanation for her bloating, nausea, and early satiety. Suspect this may be related to dietary habits as discussed at her OV.   Recommendations:  She still needs to have labs completed.  Please have patient verify that she is taking Dexilant. Stated she was receiving this from her pharmacy but I had only provided samples in the past.  Follow a low-fat diet.  Avoid all fried, fatty, greasy foods.  Follow lactose free diet or take lactaid tablets prior to any dairy consumption. Avoid foods that cause gas and bloating including broccoli, cabbage, cauliflower, beans, artificial sweetener, soda, carbonated beverages, chewing gum, and drinking through a straw.  Drink enough water to keep urine pale yellow to clear.  Try eating 4-6 small meals daily rather than 3 large meals. Zofran as needed for nausea. (prescription provided at last visit)

## 2019-08-30 ENCOUNTER — Other Ambulatory Visit: Payer: Self-pay | Admitting: Gastroenterology

## 2019-08-31 ENCOUNTER — Telehealth: Payer: Self-pay | Admitting: Emergency Medicine

## 2019-08-31 NOTE — Telephone Encounter (Signed)
[facility] from 5/11. cbc with diff , cmp and tsh are on your desk

## 2019-08-31 NOTE — Telephone Encounter (Signed)
Noted. Will review when I get a chance in the next 24-48 hours.

## 2019-09-01 ENCOUNTER — Telehealth: Payer: Self-pay | Admitting: Emergency Medicine

## 2019-09-01 NOTE — Telephone Encounter (Signed)
error 

## 2019-09-01 NOTE — Telephone Encounter (Signed)
Reviewed labs completed with [facility] dated 08/30/2019:  CBC: WBC 10.7, hemoglobin 12.3, hematocrit 39.3, MCV 86, MCH 27.0, MCHC 31.3 (L), platelets 359 CMP: Glucose 52, creatinine 0.7, sodium 143, potassium 4.0, chloride 105, calcium 9.9, albumin 4.0, total bilirubin 0.4, alk phos 151 (H), AST 23, ALT 15. TSH: 2.97  TTG IgA and IgA total are pending. Does not appear iron panel was drawn.  Magda Paganini: Please let patient know I have reviewed her labs.  Her hemoglobin has actually improved to 12.3, up from 11.3 in March.  Her alk phos is elevated and somewhat nonspecific at this time. Please verify if she was fasting when she completed her labs. We may need to repeat the lab or complete other testing to evaluate this further.   Her celiac serologies were still pending.  We will need to get another fax from [place] when celiac serologies result.  Not sure if you will need to reach out to them to inquire about this. Additionally, I had ordered an iron panel, but it doesn't look like this was drawn.   Once we figure out if we need any other labs to evaluate her elevated alk phos, we will also arrange again for an iron panel.

## 2019-09-01 NOTE — Telephone Encounter (Signed)
Called lmom

## 2019-09-02 LAB — CBC WITH DIFFERENTIAL/PLATELET
Basophils Absolute: 0 10*3/uL (ref 0.0–0.2)
Basos: 0 %
EOS (ABSOLUTE): 0.2 10*3/uL (ref 0.0–0.4)
Eos: 2 %
Hematocrit: 39.3 % (ref 34.0–46.6)
Hemoglobin: 12.3 g/dL (ref 11.1–15.9)
Immature Grans (Abs): 0 10*3/uL (ref 0.0–0.1)
Immature Granulocytes: 0 %
Lymphocytes Absolute: 3.1 10*3/uL (ref 0.7–3.1)
Lymphs: 29 %
MCH: 27 pg (ref 26.6–33.0)
MCHC: 31.3 g/dL — ABNORMAL LOW (ref 31.5–35.7)
MCV: 86 fL (ref 79–97)
Monocytes Absolute: 0.6 10*3/uL (ref 0.1–0.9)
Monocytes: 5 %
Neutrophils Absolute: 6.7 10*3/uL (ref 1.4–7.0)
Neutrophils: 64 %
Platelets: 351 10*3/uL (ref 150–450)
RBC: 4.56 x10E6/uL (ref 3.77–5.28)
RDW: 13 % (ref 11.7–15.4)
WBC: 10.7 10*3/uL (ref 3.4–10.8)

## 2019-09-02 LAB — COMPREHENSIVE METABOLIC PANEL
ALT: 15 IU/L (ref 0–32)
AST: 23 IU/L (ref 0–40)
Albumin/Globulin Ratio: 1.4 (ref 1.2–2.2)
Albumin: 4 g/dL (ref 3.7–4.7)
Alkaline Phosphatase: 151 IU/L — ABNORMAL HIGH (ref 39–117)
BUN/Creatinine Ratio: 16 (ref 12–28)
BUN: 11 mg/dL (ref 8–27)
Bilirubin Total: 0.4 mg/dL (ref 0.0–1.2)
CO2: 27 mmol/L (ref 20–29)
Calcium: 9.9 mg/dL (ref 8.7–10.3)
Chloride: 105 mmol/L (ref 96–106)
Creatinine, Ser: 0.7 mg/dL (ref 0.57–1.00)
GFR calc Af Amer: 99 mL/min/{1.73_m2} (ref >59)
GFR calc non Af Amer: 86 mL/min/{1.73_m2} (ref >59)
Globulin, Total: 2.9 g/dL (ref 1.5–4.5)
Glucose: 52 mg/dL — ABNORMAL LOW (ref 65–99)
Potassium: 4 mmol/L (ref 3.5–5.2)
Sodium: 143 mmol/L (ref 134–144)
Total Protein: 6.9 g/dL (ref 6.0–8.5)

## 2019-09-02 LAB — SPECIMEN STATUS REPORT

## 2019-09-02 LAB — TISSUE TRANSGLUTAMINASE, IGA: Transglutaminase IgA: <2 U/mL (ref 0–3)

## 2019-09-02 LAB — TSH: TSH: 2.97 u[IU]/mL (ref 0.450–4.500)

## 2019-09-02 LAB — IGA, IMMUNOGLOBULIN A (RDL): IgA, Immunoglobulin A (RDL): 229 mg/dL (ref 87–352)

## 2019-09-02 LAB — LIPASE: Lipase: 16 U/L (ref 14–85)

## 2019-09-05 ENCOUNTER — Telehealth: Payer: Self-pay | Admitting: Internal Medicine

## 2019-09-05 ENCOUNTER — Other Ambulatory Visit: Payer: Self-pay | Admitting: Emergency Medicine

## 2019-09-05 NOTE — Telephone Encounter (Signed)
Called lmom

## 2019-09-05 NOTE — Telephone Encounter (Signed)
Returned call, please see previous notes

## 2019-09-05 NOTE — Telephone Encounter (Signed)
Called pt, see previous note

## 2019-09-05 NOTE — Telephone Encounter (Signed)
Noted. We will need her to have GGT checked to help evaluate elevated alk phos. Please arrange.   I have not received any results as of yet for the celiac serologies or iron.

## 2019-09-05 NOTE — Telephone Encounter (Signed)
[phone] please call patient back on her cell

## 2019-09-06 ENCOUNTER — Other Ambulatory Visit: Payer: Self-pay | Admitting: Emergency Medicine

## 2019-09-06 DIAGNOSIS — D649 Anemia, unspecified: Secondary | ICD-10-CM

## 2019-09-06 DIAGNOSIS — Z79899 Other long term (current) drug therapy: Secondary | ICD-10-CM

## 2019-09-06 DIAGNOSIS — R197 Diarrhea, unspecified: Secondary | ICD-10-CM

## 2019-09-06 LAB — IRON,TIBC AND FERRITIN PANEL
Ferritin: 37 ng/mL (ref 15–150)
Iron Saturation: 15 % (ref 15–55)
Iron: 51 ug/dL (ref 27–139)
Total Iron Binding Capacity: 337 ug/dL (ref 250–450)
UIBC: 286 ug/dL (ref 118–369)

## 2019-09-06 LAB — SPECIMEN STATUS REPORT

## 2019-09-06 NOTE — Telephone Encounter (Signed)
Called lmom

## 2019-09-06 NOTE — Progress Notes (Signed)
I did not order lipase, but it is within normal limits.   Iron panel also within normal limits. Lower end of normal. We can continue to monitor this.   Still waiting on TTG IgA and IgA total.

## 2019-09-06 NOTE — Telephone Encounter (Signed)
Called spoke to Kipp Laurence pts sister Notified her of providers recommendations and   that ggt was order by provider and it was sent over to [facility] in [place] and to have it done as soon as she can. Also, notified her that we have not yet received results for celiac serologoed or iron. Hopefully we will get those soon. Pt sister thanked me for the call and stated she would let Winry know o

## 2019-09-07 LAB — GAMMA GT: GGT: 54 IU/L (ref 0–60)

## 2019-09-14 ENCOUNTER — Telehealth: Payer: Self-pay | Admitting: Emergency Medicine

## 2019-09-14 ENCOUNTER — Telehealth: Payer: Self-pay | Admitting: Internal Medicine

## 2019-09-14 NOTE — Telephone Encounter (Signed)
Gave pt results see "lab results"

## 2019-09-14 NOTE — Telephone Encounter (Signed)
[phone] PATIENT RETURNED CALL, PLEASE CALL BACK

## 2019-09-14 NOTE — Telephone Encounter (Signed)
Called [facility] dexilant 60mg  was approved for a 90 day supply phone number [phone], pt notified

## 2019-11-08 NOTE — Progress Notes (Signed)
Referring Provider: Michell Heinrich, DO Primary Care Physician:  Michell Heinrich, DO Primary GI Physician: Dr. Gala Romney  Chief Complaint  Patient presents with  . Bloated    mostly with eating  . Dysphagia    comes/goes    HPI:   Veronica Wiley is a 74 y.o. female presenting today for 69-monthfollow-up of bloating, early satiety, nausea without vomiting, GERD, dysphagia, diarrhea, and anemia.  EGD March 2021 for upper GI symptoms with mucosal changes in the esophagus biopsy, otherwise normal-appearing s/p empiric dilation, nodular proximal gastric mucosa biopsied.  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 pylori negative.  Lipase had also been checked and was within normal limits.  Also with history of colon cancer with colonoscopy in 2018 with Dr. PEarley Brookewith hemorrhoids, diverticulosis, and two hyperplastic polyps. Due for repeat in 2023.   She was last seen in our office 08/05/2019.  GERD symptoms seem to be doing well on Dexilant.  Continued with a "stuffy" feeling in her upper abdomen, bloated, intermittent nausea without vomiting not triggered by meals, and early satiety all of which have been present for at least 6 months.  No significant abdominal pain.  Admitted to eating fried chicken daily, fast food daily.  Reported sugars being "all over the place."  Continued with dysphagia with sensation of foods getting hung at the sternal notch.  More trouble with breads and meats.  No significant improvement with dilation.  Continued with chronic history of postprandial loose and occasionally watery BMs with total of 2-3 BMs daily.  No nocturnal stools.  No BRBPR or melena.  Abdominal exam with mild tenderness to palpation in the RUQ.  Considered gallbladder etiology versus gastroparesis versus celiac disease versus dietary habits contributing to upper GI symptoms.    Also suspected dietary habits were contributing to postprandial  loose stools versus possible diabetic enteropathy, celiac disease, or thyroid abnormalities.  Doubted IBD.  Plans for GES, RUQ ultrasound, celiac serologies, TSH, follow low-fat diet, avoid gas producing items, 4-6 small meals daily, Zofran as needed.  Plans for her BPE to evaluate dysphagia.  Regarding anemia, plan to update CBC and check iron panel, B12, and folate.  Would need to consider early interval colonoscopy should hemoglobin continues to trend down or if she has iron deficiency.  RUQ ultrasound 08/11/2019: Gallbladder and CBD within normal limits.  Fatty liver. BPE 08/11/2019: Normal exam.  No mass or stricture in the esophagus, barium tablet passed through the esophagus and into the stomach without delay.  Advised all meats to be chopped finely, eat slowly, take small bites, chew thoroughly, drink plenty of liquids throughout meals. GES 08/16/2019: Normal gastric emptying.  Advise follow a low-fat diet, lactose-free diet, avoid items that produce gas, 4-6 small meals daily, drink enough water to keep urine pale yellow to clear, Zofran as needed for nausea.  Labs completed 08/30/19: Hemoglobin 12.3, MCV 86, MCH 27.0, MCHC 31.3, iron 51, percent saturation 15%, ferritin 37 (low end of normal). TSH within normal limits.  Celiac serologies negative.  Kidney function and electrolytes within normal limits.  AST, ALT, total bilirubin within normal limits.  Alk phos mildly elevated at 151.  GGT within normal limits suggesting extrahepatic source.  She was advised to follow-up with PCP for further evaluation.  Today:  Dysphagia: Continues to feel foods are slow to move down or lodge at the sternal notch. Symptoms are very mild daily. More trouble with meats and breads. Hadn't  tried chopping them up finely. Liquids and soft foods go down fine.   Bloating: Continues with bloating in the upper abdomen. Worse after meals. Burping or passing gas doesn't help much. Sometimes she does have a lot of gas. Fried  chicken once a month. Continues with fast food frequently. [place] in the morning, ham biscuit and hash browns. [place] sandwich for lunch. Dinner may be fast food or something at home like peanut butter and crackers. Gingerale or orange soda. Doesn't drink soda daily.    No significant abdominal pain. Not a lot of vegetables. No beans.   Feels she is more on the constipated side now. Still having at least once a day but her stool is hard. Having to strain. Incomplete. Occasional will go a day without a BM. Taking a stool softener as needed. No blood in the stool. No black stool.     Sugars continue to be all over the place. Thinks last A1C was in the 7 range.   GERD: Taking Dexilant daily. Occasional breakthrough symptoms. Unable to tell me how frequent.   No nausea or vomiting. No significant abdominal pain other than the bloated symptoms.    Past Medical History:  Diagnosis Date  . Adjustment disorder with depressed mood   . Dysphagia   . GERD (gastroesophageal reflux disease)   . History of breast cancer 2020  . Hyperlipidemia   . Malignant tumor of colon ([place])    About 15 years ago per patient (documented 07/2019)  . OSA (obstructive sleep apnea)    on CPAP  . 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 Type 2 diabetes mellitus with complications ([place])   . Vitamin D deficiency     Past Surgical History:  Procedure Laterality Date  . BIOPSY  07/01/2019   Procedure: BIOPSY;  Surgeon: Daneil Dolin, MD;  Location: [facility];  Service: Endoscopy;;  esophageal, gastric  . BREAST LUMPECTOMY Right 2020   x3  . COLON RESECTION     about 15 years ago; left-sided (per patient) (documented 07/2019)  . COLONOSCOPY  07/28/2016   Dr. Earley Brooke; hemorrhoids, diverticulosis (few in the sigmoid colon), colon polyps (two-3 mm polyps biopsied from the rectum).  Pathology revealed hyperplastic polyp.  Next colonoscopy due in 2023.  Marland Kitchen ESOPHAGOGASTRODUODENOSCOPY   05/21/2016   Dr. Earley Brooke; hiatal hernia, polyps (2-3 mm inflammatory looking polyps in the antrum, biopsies taken).  Esophageal balloon dilation performed.  . ESOPHAGOGASTRODUODENOSCOPY N/A 07/01/2019   Procedure: ESOPHAGOGASTRODUODENOSCOPY (EGD);  Surgeon: Daneil Dolin, MD;  Location: [facility];  Service: Endoscopy;  Laterality: N/A;  2:45pm  . MALONEY DILATION N/A 07/01/2019   Procedure: Venia Minks DILATION;  Surgeon: Daneil Dolin, MD;  Location: [facility];  Service: Endoscopy;  Laterality: N/A;    Current Outpatient Medications  Medication Sig Dispense Refill  . aspirin EC 81 MG tablet Take 81 mg by mouth daily.    Marland Kitchen atorvastatin (LIPITOR) 80 MG tablet Take 40 mg by mouth daily.     . Cholecalciferol (VITAMIN D3) 50 MCG (2000 UT) capsule Take 2,000 Units by mouth daily.    . clopidogrel (PLAVIX) 75 MG tablet Take 75 mg by mouth daily.    Marland Kitchen dexlansoprazole (DEXILANT) 60 MG capsule Take 60 mg by mouth daily.    . diclofenac Sodium (VOLTAREN) 1 % GEL Apply 1 application topically daily as needed for pain.    . dorzolamide-timolol (COSOPT) 22.3-6.8 MG/ML ophthalmic solution Place 1 drop into  both eyes 2 (two) times daily.    . Insulin Infusion Pump DEVI by Does not apply route. Basal rate 225 ( not sure) Novolog    . letrozole (FEMARA) 2.5 MG tablet Take 2.5 mg by mouth daily.    Marland Kitchen losartan (COZAAR) 25 MG tablet Take 25 mg by mouth daily as needed (If blood pressure is over 140).     . sertraline (ZOLOFT) 100 MG tablet Take 150 mg by mouth daily.     . ondansetron (ZOFRAN) 4 MG tablet Take 1 tablet (4 mg total) by mouth every 8 (eight) hours as needed for nausea or vomiting. (Patient not taking: Reported on 11/09/2019) 30 tablet 0   No current facility-administered medications for this visit.    Allergies as of 11/09/2019 - Review Complete 11/09/2019  Allergen Reaction Noted  . Enoxaparin Other (See Comments) 06/15/2019  . Ivp dye [iodinated diagnostic agents] Other (See Comments)  06/15/2019  . Penicillins Rash 06/15/2019    Family History  Problem Relation Age of Onset  . Colon cancer Neg Hx     Social History   Socioeconomic History  . Marital status: Single    Spouse name: Not on file  . Number of children: Not on file  . Years of education: Not on file  . Highest education level: Not on file  Occupational History  . Not on file  Tobacco Use  . Smoking status: Former Research scientist (life sciences)  . Smokeless tobacco: Never Used  . Tobacco comment: Never did smoke much just one or two daily  Substance and Sexual Activity  . Alcohol use: Not Currently  . Drug use: Never  . Sexual activity: Not on file  Other Topics Concern  . Not on file  Social History Narrative  . Not on file   Social Determinants of Health   Financial Resource Strain:   . Difficulty of Paying Living Expenses:   Food Insecurity:   . Worried About Charity fundraiser in the Last Year:   . Arboriculturist in the Last Year:   Transportation Needs:   . Film/video editor (Medical):   Marland Kitchen Lack of Transportation (Non-Medical):   Physical Activity:   . Days of Exercise per Week:   . Minutes of Exercise per Session:   Stress:   . Feeling of Stress :   Social Connections:   . Frequency of Communication with Friends and Family:   . Frequency of Social Gatherings with Friends and Family:   . Attends Religious Services:   . Active Member of Clubs or Organizations:   . Attends Archivist Meetings:   Marland Kitchen Marital Status:     Review of Systems: Gen: Denies fever, chills, anorexia. Denies fatigue, weakness, weight loss.  CV: Denies chest pain or palpitations Resp: Mild SOB with exertion. None at rest. Occasional cough.  GI: See HPI Derm: Denies rash Psych: See HPI Heme: See HPI  Physical Exam: BP 133/61   Pulse 70   Temp (!) 97.1 F (36.2 C)   Ht _0  (1.575 m)   Wt 192 lb (87.1 kg)   BMI 35.12 kg/m  General:   Alert and oriented. No distress noted. Pleasant and cooperative.    Head:  Normocephalic and atraumatic. Eyes:  Conjuctiva clear without scleral icterus. Heart:  S1, S2 present without murmurs appreciated. Lungs:  Clear to auscultation bilaterally. No wheezes, rales, or rhonchi. No distress.  Abdomen:  +BS.  Abdomen is obese but soft.  Mild tenderness palpation  along the left side of her abdomen which she states has been present for years without change.  She does not feel pain unless she presses on the left side of her abdomen. No rebound or guarding. No HSM or masses noted.  Midline scar from prior colon surgery. Msk:  Symmetrical without gross deformities. Normal posture. Extremities:  Without edema. Neurologic:  Alert and  oriented x4 Psych:  Normal mood and affect.

## 2019-11-09 ENCOUNTER — Other Ambulatory Visit: Payer: Self-pay

## 2019-11-09 ENCOUNTER — Ambulatory Visit (INDEPENDENT_AMBULATORY_CARE_PROVIDER_SITE_OTHER): Payer: Medicare Other | Admitting: Gastroenterology

## 2019-11-09 ENCOUNTER — Encounter: Payer: Self-pay | Admitting: Gastroenterology

## 2019-11-09 VITALS — BP 133/61 | HR 70 | Temp 97.1°F | Ht 62.0 in | Wt 192.0 lb

## 2019-11-09 DIAGNOSIS — K219 Gastro-esophageal reflux disease without esophagitis: Secondary | ICD-10-CM | POA: Diagnosis not present

## 2019-11-09 DIAGNOSIS — R140 Abdominal distension (gaseous): Secondary | ICD-10-CM | POA: Diagnosis not present

## 2019-11-09 DIAGNOSIS — R1310 Dysphagia, unspecified: Secondary | ICD-10-CM

## 2019-11-09 DIAGNOSIS — R194 Change in bowel habit: Secondary | ICD-10-CM

## 2019-11-09 NOTE — Assessment & Plan Note (Signed)
Continues to have mild intermittent dysphagia symptoms with sensation of foods moving down her esophagus swelling or locking of the sternal notch.  Symptoms resolve with drinking liquids.  Primarily, she has trouble with meats and breads.  EGD in March 2021 with dilation with no improvement.  Follow-up BPE 08/09/2019 with normal exam.  Overall, I still suspect a motility issue contributing to her mild symptoms.  Advise for all meats to be chopped.  Take small bites, chew thoroughly, and drink plenty of liquids throughout meals.

## 2019-11-09 NOTE — Assessment & Plan Note (Addendum)
Several month history of postprandial bloating without any significant abdominal pain.  Previously, I have felt symptoms were likely dietary related.  She has made slight changes with limiting fried chicken but she continues eating fast food daily, sometimes twice a day, and drinking soda.  Notably, she has had chronic history of postprandial loose stools but she has noted recent change in bowel habits, now struggling with constipation.  Denies bright red blood per rectum or melena.  She had lost about 7 pounds but weight has been stable for the last 3 months.  EGD in March 2021 with mucosal changes in the esophagus s/p biopsy, nodular proximal gastric mucosa of uncertain significance s/p biopsy.  Esophageal biopsy with chronic inflammation gastric biopsy with mild chronic gastritis without activity, H. pylori negative.  Celiac serologies and TSH within normal limits.  RUQ ultrasound with gallbladder and CBD within normal limits.  GES also within normal limits.  I am still most suspicious that dietary habits are a key contributing factor to her postprandial bloating. SIBO is also in the differential. Now she is struggling with constipation, this may also be contributing. Notably, she does have history of colon cancer about 15 years ago with last colonoscopy in 2018 with Dr. Earley Brooke, due for colonoscopy in 2023.    Plan: Start MiraLAX 1 capful (17 g) daily in 8 ounces of water. Add daily probiotic.  Recommended Hardin Negus colon health, align, digestive advantage, Florastor, or Restora Drink enough water to keep urine pale yellow to clear. Counseled extensively on the need for dietary change:  Avoid all fast food.  Follow a low-fat diet. Avoid fried, fatty, greasy foods.All meats should be lean (poultry or fish) and baked, boiled, or broiled.    Limit processed foods.  Avoid items that cause gas including broccoli, cauliflower, cabbage, Brussels sprouts, beans, carbonated beverages, artificial sweeteners,  drinking through a straw. Plan to follow-up in 3 months.  Requested progress report in 2-4 weeks regarding constipation.  Consider empiric treatment for SIBO if dietary changes do not improve bloating.  May also need to consider early interval colonoscopy due to changes in bowel habits.

## 2019-11-09 NOTE — Patient Instructions (Signed)
Please add MiraLAX 1 capful (17 g) daily in 8 ounces of water to help with constipation.  Try adding a daily probiotic.  [facility] colon health, align, digestive advantage, Florastor, or restora are good options.  Drink enough water to keep urine pale yellow to clear.  Very important that we continue to work on dietary changes as I suspect this is contributing to your bloating. Please avoid all fast food. Follow a low-fat diet.  All meats should be lean (poultry or fish) and baked, boiled, or broiled.   Avoid fried, fatty, greasy foods. Limit processed foods. Avoid items that cause gas including broccoli, cauliflower, cabbage, Brussels sprouts, beans, carbonated beverages, artificial sweeteners, drinking through a straw.  Continue Dexilant 60 mg daily.  For your trouble swallowing: I recommend all your meats to be chopped finely. Take small bites, chew thoroughly, and drink plenty of liquids throughout your meals.  We will plan to see you back in 3 months.  Please call me back in 2-4 weeks to let me know how MiraLAX is working for your constipation.  Do not hesitate to call with other questions or concerns prior to your next visit.  [name], PA-C [facility]

## 2019-11-09 NOTE — Assessment & Plan Note (Signed)
Patient has experienced change in bowel habits.  Chronically, she has typically had 2-3 postprandial loose with occasional watery BMs.  She is now struggling with constipation.  Typically still having at least 1 BM daily but stools are small, incomplete, and require straining.  She is taking a stool softener as needed.  Denies bright red blood per rectum or melena.  Weight is stable.  She does report history of colon cancer about 15 years ago.  Last colonoscopy in 2018 with Dr. Earley Brooke with hemorrhoids, diverticulosis, and two hyperplastic polyps. Due for repeat in 2023.   Unclear etiology of change in bowel habits.  We will try to treat symptomatically for now.  May need to consider early interval colonoscopy.  Plan: Add MiraLAX 1 capful (17 g) daily in 8 ounces of water. Drink enough water to keep urine pale yellow to clear. Follow-up in 3 months.  Requested progress report in 2-4 weeks.

## 2019-11-09 NOTE — Assessment & Plan Note (Signed)
Typical GERD symptoms are well controlled on Dexilant 60 mg daily.  Advise she continue her current medications.  Follow-up in 3 months for bloating.

## 2019-11-10 NOTE — Progress Notes (Signed)
CC'ED TO PCP 

## 2019-12-17 ENCOUNTER — Other Ambulatory Visit: Payer: Self-pay | Admitting: Internal Medicine

## 2020-02-08 NOTE — Progress Notes (Signed)
Referring Provider: Michell Heinrich, DO Primary Care Physician:  Michell Heinrich, DO Primary GI Physician: Dr. Gala Romney  Chief Complaint  Patient presents with   Constipation    f/u. BM's daily   Bloated    depends on what she eats    HPI:   Veronica Wiley is a 74 y.o. female with presenting today for follow-up.  History of GERD, dysphagia s/p dilation in March 2021 with no improvement, postprandial bloating, early satiety, postprandial loose stools, colon cancer around 2006 with partial colectomy, and more recently with change in bowel habits to mild constipation.  Also noted to have slightly low hemoglobin in March 2021 at 11.3, but hemoglobin improved to 12.3 in May 2021.  EGD March 2021 with mucosal changes in the esophagus (benign biopsy) but otherwise normal-appearing s/p empiric dilation, nodular proximal gastric mucosa (biopsy with mild chronic gastritis, no H. Pylori).  Colonoscopy up-to-date in 2018 with Dr. Earley Brooke with hemorrhoids, diverticulosis, and two hyperplastic polyps. Due for repeat in 2023.     Prior laboratory evaluation with lipase within normal limits, CMP with mildly elevated alk phos at 151 but GGT within normal limits and alk phso returned to normal at 113 in September 2021 (care everywhere), TSH within normal limits, celiac serologies negative.   RUQ ultrasound April 2021 with normal-appearing gallbladder and fatty liver.   BPE April 2021 with normal exam. GES April 2021 within normal limits.  At her last visit in July 2021, she continued to feel foods were moving down her esophagus slowly or would get hung at the sternal notch.  Continued with upper abdominal bloating after meals and not improved by burping or passing gas.  Continued eating fast food at least once or twice daily.  Drinking ginger ale or orange soda intermittently.  Did not consume many vegetables or beans.  Had a change in bowel habits and was experiencing mild constipation with incomplete bowel  movements and straining.  No BRBPR or melena.  Sugars continue to be "all over the place".  GERD well controlled.  No nausea or vomiting.  Suspected dietary habits were contributing to postprandial bloating.  Unclear etiology of her change in bowel habits but with plan to treat symptomatically with low threshold for early interval colonoscopy.  Plan to start MiraLAX daily, add daily probiotic, increase water consumption and avoid all fast food and follow a low-fat diet, avoid gas producing items, follow chopped diet due to dysphagia, continue Dexilant, follow-up in 3 months.  Requested progress report in 2 to 4 weeks.  No progress report received.  Today:  GERD: Well controlled on Dexilant daily.   Dysphagia: Continues with intermittent solid foods dysphagia. Items hang at sternal notch. Pass with liquid.  Has history of mini strokes/mild stroke. Wants to hold off on referral to SLP.   Bloating: Improving. Bloating about 3 days a week. Thinks it is caused by certain things she is eating.  No upper abdominal pain. Weight is stable/up 2 lbs. No upper abdominal pain. Specifically no RUQ pain after eating. No nausea or vomiting.   Has a lot of gas.   Change in bowel habits/constipation: BMs daily, sometimes a few times a day following meals. Depends on what she eats. Usually about 2 BMs a day. Sometimes with 3-5 BMs a day 2-3 days a week. No nocturnal stool. Sometimes stools are formed other times they can be mushy to watery. Fast foods cause her to have loose BMs. Still with fast food every morning. Occasional fast food  for lunch or dinner. Eating fried chicken once every 2 weeks. No recent antibiotics. Not taking MiraLAX.NEver needed this. No blood in the stool or black stools.     Occasional short lived "bad" lower abdominal pain. No identified trigger. No associated with BM, meals, or any urinary issues. Maybe twice a month.   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 Earley Brooke; hemorrhoids, diverticulosis (few in the sigmoid colon), colon polyps (two-3 mm polyps biopsied from the rectum).  Pathology revealed hyperplastic polyp.  Next colonoscopy due in 2023.   ESOPHAGOGASTRODUODENOSCOPY  05/21/2016   Dr. Earley Brooke; hiatal hernia, polyps (2-3 mm inflammatory looking polyps in the antrum, biopsies taken).  Esophageal balloon dilation performed.   ESOPHAGOGASTRODUODENOSCOPY N/A 07/01/2019   Procedure: ESOPHAGOGASTRODUODENOSCOPY (EGD);  Surgeon: Daneil Dolin, MD;   mucosal changes in the esophagus (benign biopsy) but otherwise normal-appearing s/p empiric dilation, nodular proximal gastric mucosa (biopsy with mild chronic gastritis, no H. Pylori).     MALONEY DILATION N/A 07/01/2019   Procedure: Venia Minks DILATION;  Surgeon: Daneil Dolin, MD;  [facility]: [facility];  Service: Endoscopy;  Laterality: N/A;    Current Outpatient Medications  Medication Sig Dispense Refill   aspirin EC 81 MG tablet Take 81 mg by mouth daily.     atorvastatin (LIPITOR) 80 MG tablet Take 40 mg by mouth daily.      Cholecalciferol (VITAMIN D3) 50 MCG (2000 UT) capsule Take 2,000 Units by mouth daily.     clopidogrel (PLAVIX)  75 MG tablet Take 75 mg by mouth daily.     DEXILANT 60 MG capsule TAKE 1 CAPSULE BY MOUTH EVERY DAY 90 capsule 1   diclofenac Sodium (VOLTAREN) 1 % GEL Apply 1 application topically daily as needed for pain.     dorzolamide-timolol (COSOPT) 22.3-6.8 MG/ML ophthalmic solution Place 1 drop into both eyes 2 (two) times daily.     Insulin Infusion Pump DEVI by Does not apply route. Basal rate 225 ( not sure) Novolog     letrozole (FEMARA) 2.5 MG tablet Take 2.5 mg by mouth daily.     losartan (COZAAR) 25 MG tablet Take 25 mg by mouth daily as needed (If blood pressure is over 140).      sertraline (ZOLOFT) 100 MG tablet Take 150 mg by mouth daily.      No current facility-administered medications for this visit.    Allergies as of 02/09/2020 - Review Complete 02/09/2020  Allergen Reaction Noted   Enoxaparin Other (See Comments) 06/15/2019   Ivp dye [iodinated diagnostic agents] Other (See Comments) 06/15/2019   Penicillins  Rash 06/15/2019    Family History  Problem Relation Age of Onset   Colon cancer Neg Hx     Social History   Socioeconomic History   Marital status: Single    Spouse name: Not on file   Number of children: Not on file   Years of education: Not on file   Highest education level: Not on file  Occupational History   Not on file  Tobacco Use   Smoking status: Former Smoker   Smokeless tobacco: Never Used   Tobacco comment: Never did smoke much just one or two daily  Substance and Sexual Activity   Alcohol use: Not Currently   Drug use: Never   Sexual activity: Not on file  Other Topics Concern   Not on file  Social History Narrative   Not on file   Social Determinants of Health   Financial Resource Strain:    Difficulty of Paying Living Expenses: Not on file  Food Insecurity:    Worried About [place] in the Last Year: Not on file   [place] in the Last Year: Not on file  Transportation Needs:    Lack of  Transportation (Medical): Not on file   Lack of Transportation (Non-Medical): Not on file  Physical Activity:    Days of Exercise per Week: Not on file   Minutes of Exercise per Session: Not on file  Stress:    Feeling of Stress : Not on file  Social Connections:    Frequency of Communication with Friends and Family: Not on file   Frequency of Social Gatherings with Friends and Family: Not on file   Attends Religious Services: Not on file   Active Member of Clubs or Organizations: Not on file   Attends Archivist Meetings: Not on file   Marital Status: Not on file    Review of Systems: Gen: Denies fever, chills, cold or flulike symptoms, lightheadedness, dizziness, presyncope, syncope. CV: Denies chest pain or palpitations Resp: Admits to SOB with exertion. No cough GI: See HPI Heme: See HPI  Physical Exam: BP 132/80    Pulse (!) 105    Temp (!) 97 F (36.1 C)    Ht $R'5\' 2"'JZ$  (2.248 m)    Wt 193 lb 9.6 oz (87.8 kg)    BMI 35.41 kg/m  General:   Alert and oriented. No distress noted. Pleasant and cooperative.  Head:  Normocephalic and atraumatic. Eyes:  Conjuctiva clear without scleral icterus. Heart:  S1, S2 present without murmurs appreciated. Lungs:  Clear to auscultation bilaterally. No wheezes, rales, or rhonchi. No distress.  Abdomen: +BS, soft, and non-distended. Tenderness to deep palpation in RUQ. Tender to moderate palpation in LLQ. No rebound or guarding. No HSM or masses noted. Msk:  Symmetrical without gross deformities. Normal posture. Extremities:  Without edema. Neurologic:  Alert and  oriented x4 Psych: Normal mood and affect.

## 2020-02-09 ENCOUNTER — Encounter: Payer: Self-pay | Admitting: Gastroenterology

## 2020-02-09 ENCOUNTER — Encounter: Payer: Self-pay | Admitting: *Deleted

## 2020-02-09 ENCOUNTER — Ambulatory Visit (INDEPENDENT_AMBULATORY_CARE_PROVIDER_SITE_OTHER): Payer: Medicare Other | Admitting: Gastroenterology

## 2020-02-09 ENCOUNTER — Other Ambulatory Visit: Payer: Self-pay

## 2020-02-09 VITALS — BP 132/80 | HR 105 | Temp 97.0°F | Ht 62.0 in | Wt 193.6 lb

## 2020-02-09 DIAGNOSIS — R194 Change in bowel habit: Secondary | ICD-10-CM

## 2020-02-09 DIAGNOSIS — G8929 Other chronic pain: Secondary | ICD-10-CM | POA: Insufficient documentation

## 2020-02-09 DIAGNOSIS — R140 Abdominal distension (gaseous): Secondary | ICD-10-CM | POA: Diagnosis not present

## 2020-02-09 DIAGNOSIS — R1032 Left lower quadrant pain: Secondary | ICD-10-CM

## 2020-02-09 DIAGNOSIS — K219 Gastro-esophageal reflux disease without esophagitis: Secondary | ICD-10-CM | POA: Diagnosis not present

## 2020-02-09 DIAGNOSIS — R1310 Dysphagia, unspecified: Secondary | ICD-10-CM

## 2020-02-09 NOTE — Patient Instructions (Addendum)
Please have CT completed to help evaluate chronic abdominal pain.   Continue Dexilant 60 mg daily.   Please try taking a daily probiotic.  [facility]ips colon health, align, digestive advantage are good options.  It is very important that you work on dietary adjustments. Please avoid all fast food for now. Try to cook more at home.  All meats should be lean (poultry or fish). Avoid fried, fatty, greasy foods. Limit sweets, sweet tea, soda, and other sugary beverages.  Please call me in 4 weeks to let me know if your bloating is improving with daily probiotic and dietary adjustments.  Also suspect this will help with your occasional diarrhea.  We will plan to see back in 4 months.  Do not hesitate to call if you have questions or concerns for her.  It was great seeing you today!  [name], PA-C [facility]

## 2020-02-10 ENCOUNTER — Encounter: Payer: Self-pay | Admitting: Gastroenterology

## 2020-02-10 NOTE — Assessment & Plan Note (Signed)
Difficult GERD well controlled on Dexilant 60 mg daily.  Advise she continue her current medications.

## 2020-02-10 NOTE — Assessment & Plan Note (Addendum)
Patient had experienced a change in bowel habits in July 2021 from chronic postprandial loose bowel movements to mild constipation.  Bowel movements have now returned to baseline with no intervention.  Currently with 2 BMs daily.  2 to 3 days a week, she will have 3-5 loose to watery BMs a day which she reports being triggered by certain foods.  Notably, she continues eating fast food for breakfast daily and occasionally for lunch or dinner.  No nocturnal BMs, recent antibiotics, BRBPR, melena, or abdominal pain prior to BMs.  Colonoscopy up-to-date in 2018, due for repeat in 2023.  Suspect patient's diet is the etiology of her chronic postprandial loose BMs. Could also have diabetic enteropathy in the setting of uncontrolled diabetes. SIBO also on the differential. Could have component of IBS, although not consistent with this.   Plan:  Discussed the importance of dietary adjustments:  Avoid all fast food for now.  Try to cook more at home.  All meats should be lean (poultry or fish).  Avoid fried, fatty, greasy foods.  Limit sweets, sweet tea, soda, and other sugary beverages. Start daily probiotic.  Recommended Hardin Negus colon health, align, digestive advantage. Requested progress report in 4 weeks.  Could consider empiric treatment of SIBO. Follow-up in 4 months.

## 2020-02-10 NOTE — Assessment & Plan Note (Addendum)
6+ month history of postprandial bloating.  Had also reported some early satiety previously. She also has history of diabetes that is not adequately controlled. Denies any significant abdominal pain, nausea, or vomiting.  Prior evaluation with EGD March 2021 mucosal changes in the esophagus s/p biopsy (chronic inflammation), gastric mucosa of uncertain significance s/p biopsy (mild chronic gastritis without activity, H. pylori negative).  Celiac serologies and TSH within normal limits.  RUQ ultrasound with gallbladder and CBD within normal limits.  GES also within normal limits.   Overall, patient reports symptoms are somewhat improved and occurring about 3 days a week.  Feels this is triggered by certain things she is eating. Continues to eat fast food every morning and occasionally for lunch or dinner.  Sweet tea daily. Abdominal exam with mild tenderness to deep palpation in the RUQ which is a chronic finding with normal appearing gallbladder on [place] previously. Additionally, patient denies any RUQ pain with meals.   Most suspicious that symptoms are dietary related in the setting of frequent fried/fatty foods as she has noticed mild improvement with minimal dietary changes. SIBO is also in the differential. Less likely functional gallbladder disorder.   Plan:  Again counseled on the importance of dietary adjustments:  Avoiding all fast foods.  Try to cook more at home.   All meats should be lean (poultry or fish).  Avoid fried, fatty, greasy foods.  Limit sweets, sweet tea, soda, and other sugary beverages. Add daily probiotic- recommended digestive advantage, align, or phillips colon health.  Requested progress report in 4 weeks to let me know if bloating is improving with dietary adjustments and daily probiotic.  Consider empiric treatment for SIBO. Follow-up in 4 months.

## 2020-02-10 NOTE — Assessment & Plan Note (Addendum)
Chronic LLQ tenderness to palpation. Otherwise, she has intermittent "bad" lower abdominal pain occurring about twice a month without identified trigger and self resolves within a few minutes. Not associated with BMs, meals, or urinary symptoms. No brbpr or melena. History of colon cancer around 2006 s/p partial colectomy in [place] (per patient). Also reports history of hysterectomy.  Last colonoscopy in 2018 with Dr. Earley Brooke with hemorrhoids, diverticulosis, and 2 hyperplastic polyps, due for repeat in 2023.  Intermittent lower abdominal pain and tenderness palpation could be in the setting of adhesive disease.  Less likely malignancy.  Due to chronicity, will proceed with CT.  Due to contrast allergy, will proceed with CT with oral contrast only.

## 2020-02-10 NOTE — Assessment & Plan Note (Addendum)
Continues with intermittent sensation of solid foods getting hung at the sternal notch but will pass with drinking liquids.  Prior evaluation with EGD in March 2021 with dilation with no improvement.  Follow-up BPE April 2021 with normal exam.  Patient has history of stroke.  Veronica Wiley reports history of mini strokes as well.  Query whether prior stroke may be influencing symptoms of dysphagia possibly secondary to oropharyngeal weakness.  Offered SLP referral for further evaluation.  Patient declined.  Veronica Wiley will continue with prior recommendations including taking small bites, chewing thoroughly, and drink plenty of liquids throughout meals. Advised Veronica Wiley let us know of any worsening or if Veronica Wiley would like a referral.

## 2020-03-02 ENCOUNTER — Other Ambulatory Visit: Payer: Self-pay

## 2020-03-02 ENCOUNTER — Ambulatory Visit (HOSPITAL_COMMUNITY)
Admission: RE | Admit: 2020-03-02 | Discharge: 2020-03-02 | Disposition: A | Discharge status: Home / Self Care | Payer: Medicare Other | Source: Ambulatory Visit | Attending: Gastroenterology | Admitting: Gastroenterology

## 2020-03-02 DIAGNOSIS — R1032 Left lower quadrant pain: Secondary | ICD-10-CM | POA: Diagnosis present

## 2020-03-02 DIAGNOSIS — G8929 Other chronic pain: Secondary | ICD-10-CM | POA: Insufficient documentation

## 2020-03-02 IMAGING — CT CT ABD-PELV W/O CM
2 of 4 series · 15 of 46 positions shown, 17 images · non-contrast
Comparison: Ultrasound abdomen [DATE]

CLINICAL DATA: Left lower quadrant abdominal pain. Tenderness to
palpation. Intermittent bat lower abdominal pain occurred twice a
month for last 4 months. History of colon cancer in [PY] status post
partial cholecystectomy. Hysterectomy and history of breast cancer.

EXAM:
CT ABDOMEN AND PELVIS WITHOUT CONTRAST
TECHNIQUE: Multidetector CT imaging of the abdomen and pelvis was performed
following the standard protocol without IV contrast.

[Series 2: axial st · axial · 0.89mm/px · z∈[+884,+1238]mm · 12 of 83 slices shown, 14 images]
[im 6/83  soft-tissue]
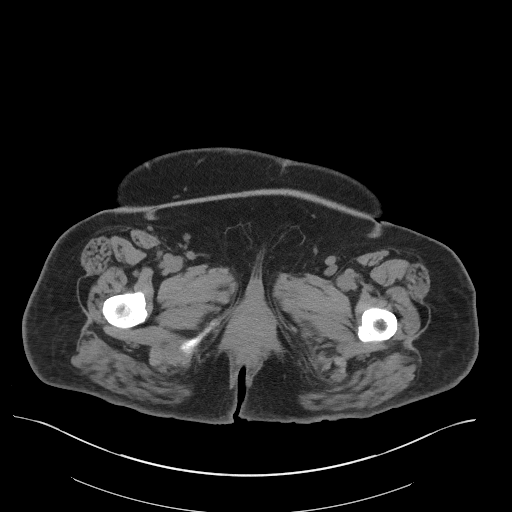
[im 6/83  bone]
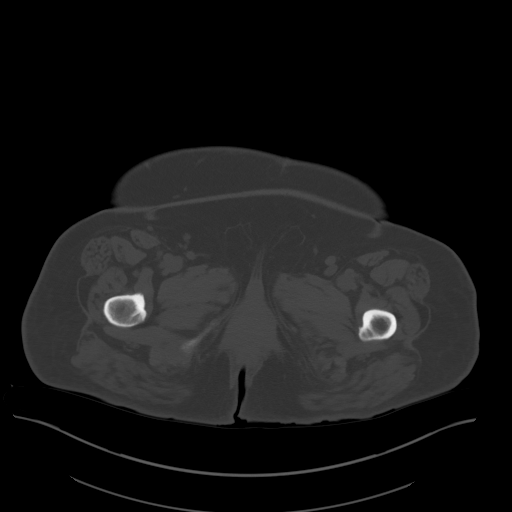
[im 11/83  soft-tissue]
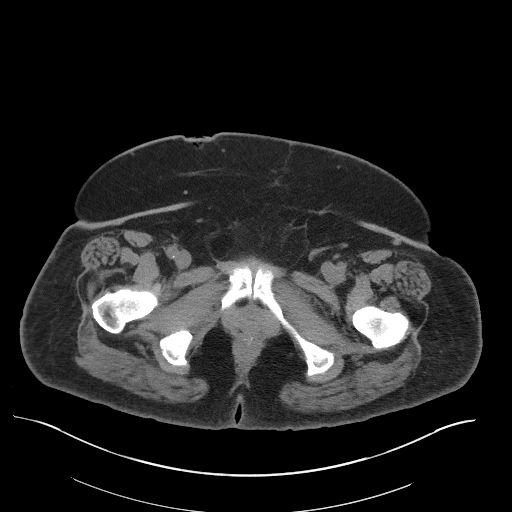
[im 21/83  soft-tissue]
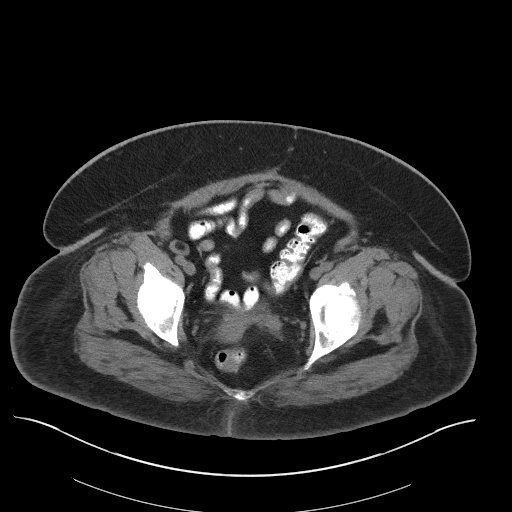
[im 26/83  soft-tissue]
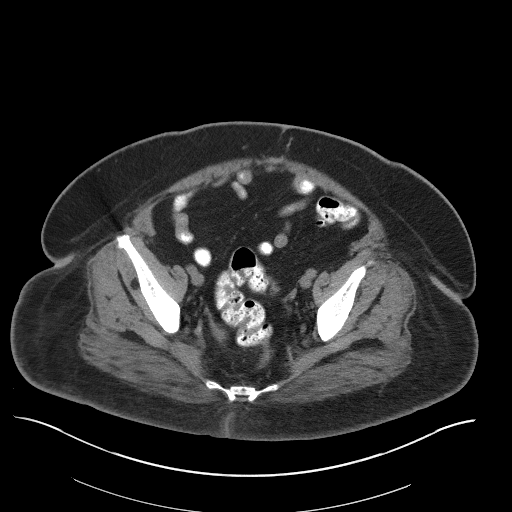
[im 31/83  soft-tissue]
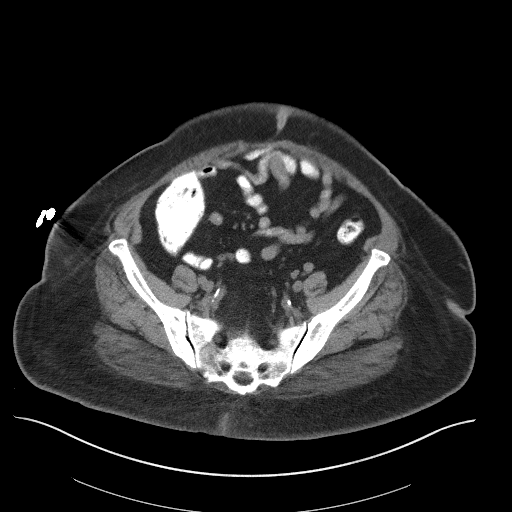
[im 36/83  soft-tissue]
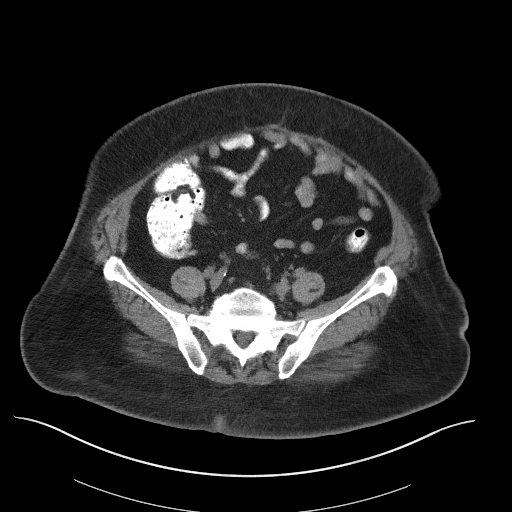
[im 47/83  soft-tissue]
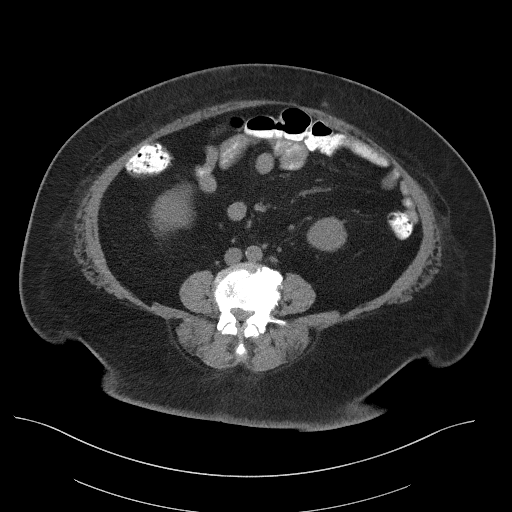
[im 52/83  soft-tissue]
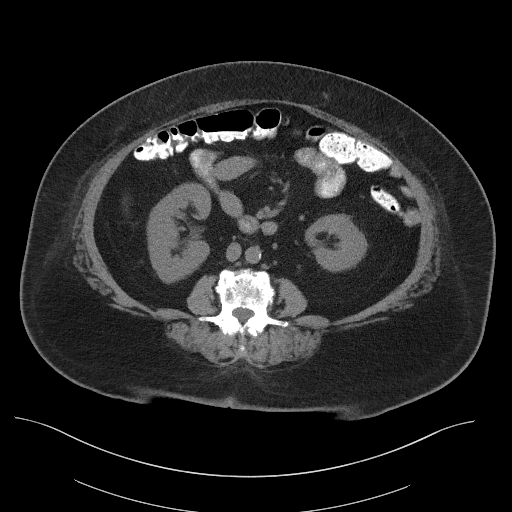
[im 57/83  soft-tissue]
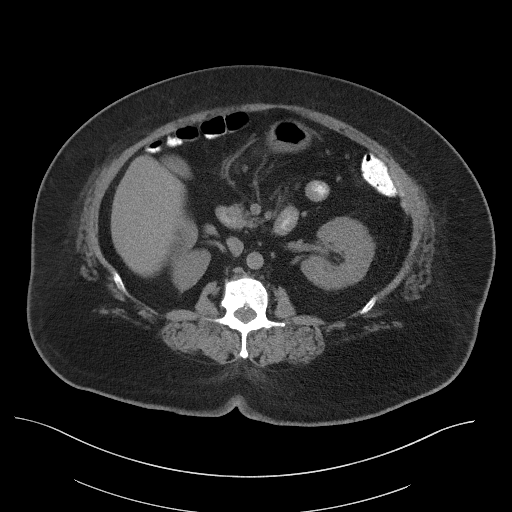
[im 57/83  bone]
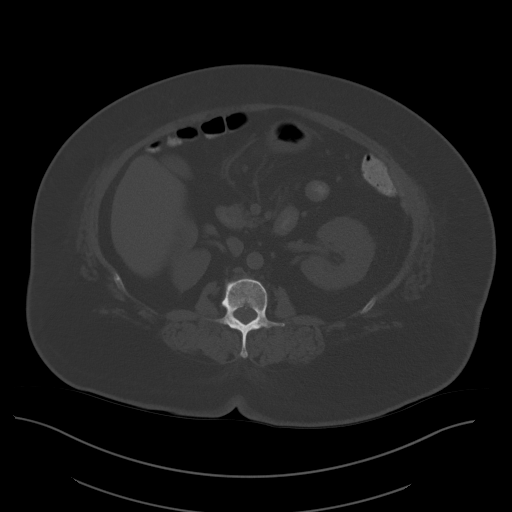
[im 62/83  soft-tissue]
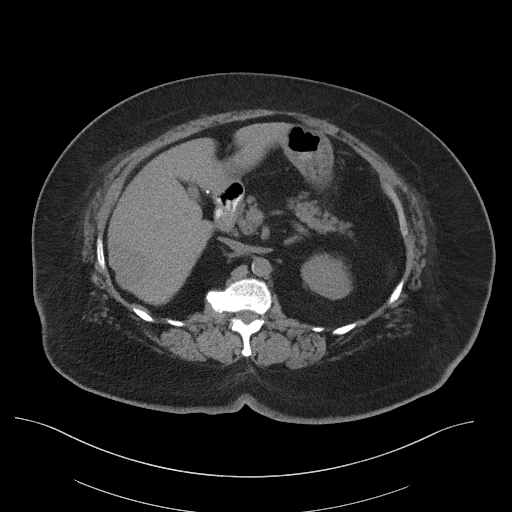
[im 72/83  soft-tissue]
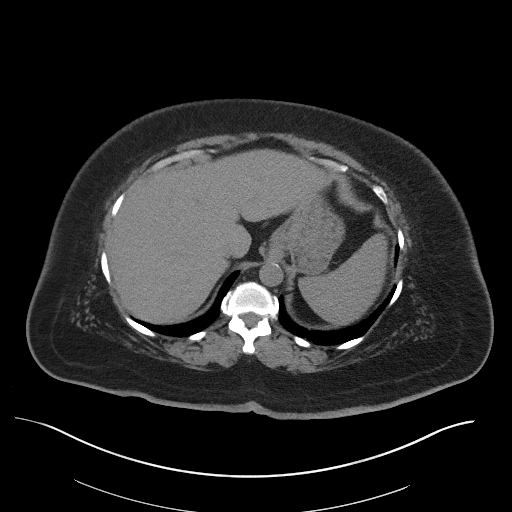
[im 77/83  soft-tissue]
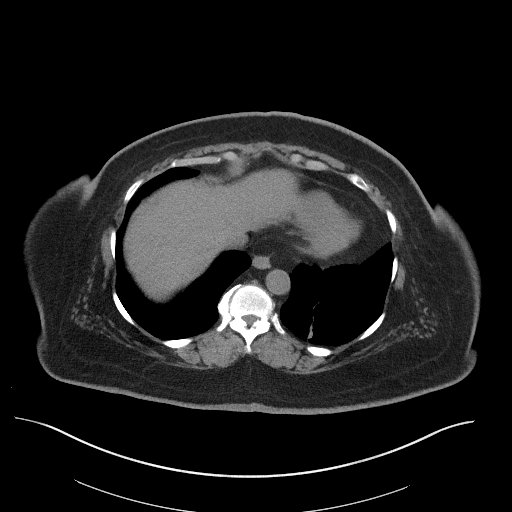

[Series 5: coronal st · coronal · 0.80mm/px · 3 of 114 slices shown]
[im 38/114  soft-tissue]
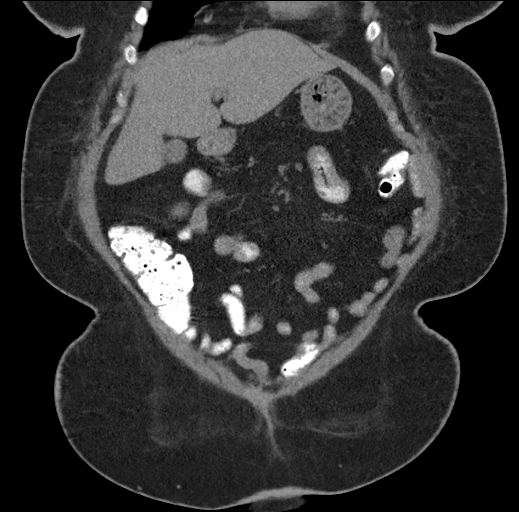
[im 51/114  soft-tissue]
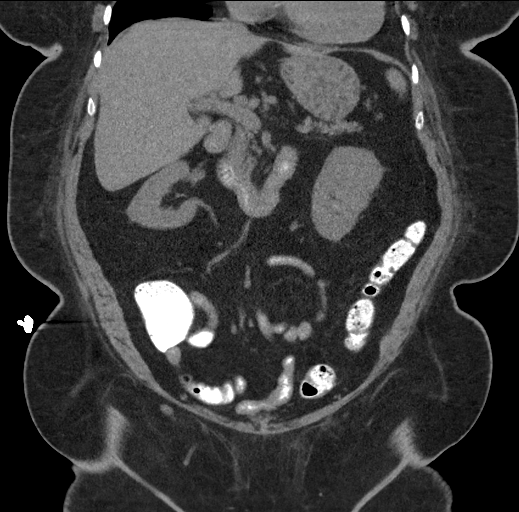
[im 63/114  soft-tissue]
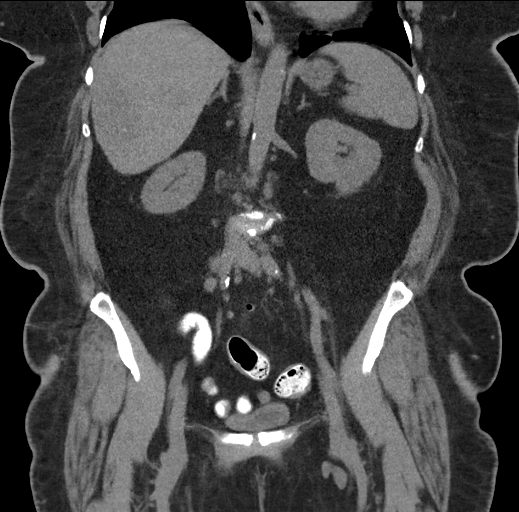

[15 of 46 positions shown; findings below may reference images not displayed]

FINDINGS: Lower chest: 1 cm nodular-like density in the left lower lobe.
Finding associated with linear atelectasis/scarring. Suggestion of
mild bronchiectasis within the left lower lobe.

Hepatobiliary: Poorly visualized and incompletely evaluated
ill-defined 5.6 x 4 cm right hepatic lobe hypodense lesion ([DATE]).
Other areas of the liver appears slightly heterogeneous. The
gallbladder is unremarkable. No biliary ductal dilatation.

Pancreas: No focal lesion. Normal pancreatic contour. No surrounding
inflammatory changes. No main pancreatic ductal dilatation.

Spleen: Normal in size without focal abnormality.

Adrenals/Urinary Tract: No adrenal nodule bilaterally. Subcentimeter
hypodensities are too small to characterize. No nephrolithiasis, no
hydronephrosis, and no contour-deforming renal mass. No
ureterolithiasis or hydroureter. The urinary bladder is decompressed
and grossly unremarkable.

Stomach/Bowel: PO contrast reaches the rectum. Stomach is within
normal limits. No evidence of bowel wall thickening or dilatation.
Couple of sigmoid diverticula. Appendix appears normal.

Vascular/Lymphatic: No abdominal aorta or iliac aneurysm. Mild
atherosclerotic plaque of the aorta and its branches. No abdominal,
pelvic, or inguinal lymphadenopathy.

Reproductive: Status post hysterectomy. There is a right adnexal
region 3.1 x 3.6 cm fluid density lesion that appears to be
inseparable from a short loop of small bowel within the pelvis ([DATE]:
[DATE], [DATE]).

Other: No intraperitoneal free fluid. No intraperitoneal free gas.
No organized fluid collection.

Musculoskeletal:

Bilateral gluteal subcutaneus soft tissue injection granulomas. No
abdominal wall hernia or abnormality.

No suspicious lytic or blastic osseous lesions. No acute displaced
fracture. Multilevel at least moderate degenerative changes of the
spine.
IMPRESSION: 1. Indeterminate, incompletely evaluated, ill-defined 5.6 x 4 cm
right hepatic lobe hypodense lesion. Consider MRI liver protocol for
further evaluation.
2. Indeterminate 3.1 x 3.6 cm right adnexal region fluid density
lesion that appears to be inseparable from a short loop of small
bowel within the pelvis. Finding could represent a benign or
malignant lesion arising from the small bowel, mesentery, or right
ovarian/adnexal region. Consider cross-sectional imaging with
intravenous contrast versus pelvic ultrasound for further
evaluation.
3. An indeterminate 1 cm nodular-like density in the left lower lobe
that is associated with scarring and mild bronchiectasis.

These results will be called to the ordering clinician or
representative by the Radiologist Assistant, and communication
documented in the PACS or [REDACTED].

## 2020-03-05 ENCOUNTER — Other Ambulatory Visit: Payer: Self-pay | Admitting: *Deleted

## 2020-03-05 ENCOUNTER — Telehealth: Payer: Self-pay

## 2020-03-05 ENCOUNTER — Telehealth: Payer: Self-pay | Admitting: *Deleted

## 2020-03-05 DIAGNOSIS — K769 Liver disease, unspecified: Secondary | ICD-10-CM

## 2020-03-05 DIAGNOSIS — R9389 Abnormal findings on diagnostic imaging of other specified body structures: Secondary | ICD-10-CM

## 2020-03-05 NOTE — Telephone Encounter (Signed)
Please see result note 

## 2020-03-05 NOTE — Telephone Encounter (Signed)
Patient requesting sedative Rx to take prior to her MRI on 11/26. Reports she is very claustrophobic and knows she won't be able to do it without something to help relax her. Patient uses [facility], [address]

## 2020-03-05 NOTE — Telephone Encounter (Signed)
Received a call report from [place] (radiology).   IMPRESSION: 1. Indeterminate, incompletely evaluated, ill-defined 5.6 x 4 cm right hepatic lobe hypodense lesion. Consider MRI liver protocol for further evaluation. 2. Indeterminate 3.1 x 3.6 cm right adnexal region fluid density lesion that appears to be inseparable from a short loop of small bowel within the pelvis. Finding could represent a benign or malignant lesion arising from the small bowel, mesentery, or right ovarian/adnexal region. Consider cross-sectional imaging with intravenous contrast versus pelvic ultrasound for further evaluation. 3. An indeterminate 1 cm nodular-like density in the left lower lobe that is associated with scarring and mild bronchiectasis.  These results will be called to the ordering clinician or representative by the Radiologist Assistant, and communication documented in the PACS or [employer].

## 2020-03-05 NOTE — Telephone Encounter (Signed)
Noted  

## 2020-03-06 ENCOUNTER — Other Ambulatory Visit: Payer: Self-pay

## 2020-03-06 ENCOUNTER — Ambulatory Visit (HOSPITAL_COMMUNITY)
Admission: RE | Admit: 2020-03-06 | Discharge: 2020-03-06 | Disposition: A | Discharge status: Home / Self Care | Payer: Medicare Other | Source: Ambulatory Visit | Attending: Gastroenterology | Admitting: Gastroenterology

## 2020-03-06 DIAGNOSIS — Z90710 Acquired absence of both cervix and uterus: Secondary | ICD-10-CM | POA: Diagnosis not present

## 2020-03-06 DIAGNOSIS — R9389 Abnormal findings on diagnostic imaging of other specified body structures: Secondary | ICD-10-CM

## 2020-03-06 DIAGNOSIS — N898 Other specified noninflammatory disorders of vagina: Secondary | ICD-10-CM | POA: Diagnosis not present

## 2020-03-06 IMAGING — US US PELVIS COMPLETE WITH TRANSVAGINAL
2 series · 13 of 25 positions shown · non-contrast
Comparison: Prior CT from [DATE].

CLINICAL DATA: Follow-up examination for right adnexal lesion seen
on prior CT. History of prior hysterectomy.

EXAM:
TRANSABDOMINAL AND TRANSVAGINAL ULTRASOUND OF PELVIS
TECHNIQUE: Both transabdominal and transvaginal ultrasound examinations of the
pelvis were performed. Transabdominal technique was performed for
global imaging of the pelvis including uterus, ovaries, adnexal
regions, and pelvic cul-de-sac. It was necessary to proceed with
endovaginal exam following the transabdominal exam to visualize the
pelvic structures.

[Series 1: us pelvic complete with transvaginal · 12 of 65 slices shown]
[im 1/65]
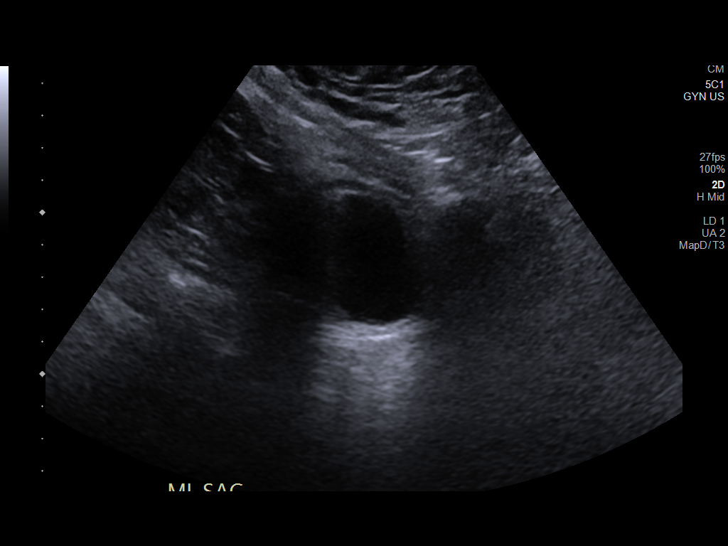
[im 6/65]
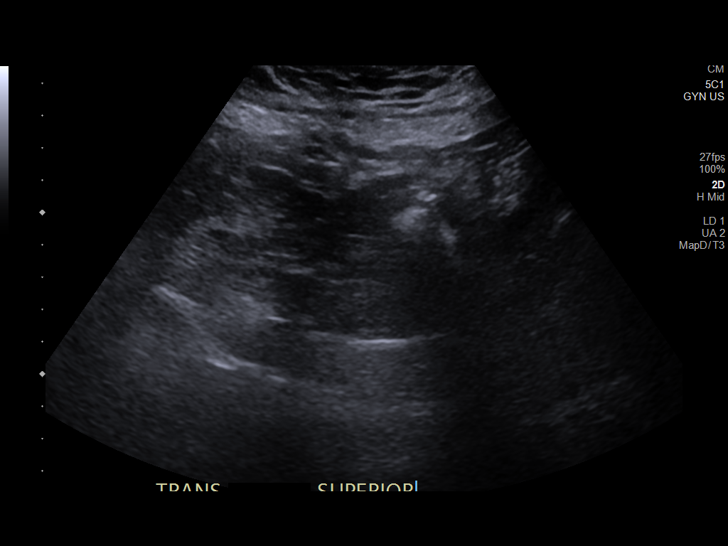
[im 12/65]
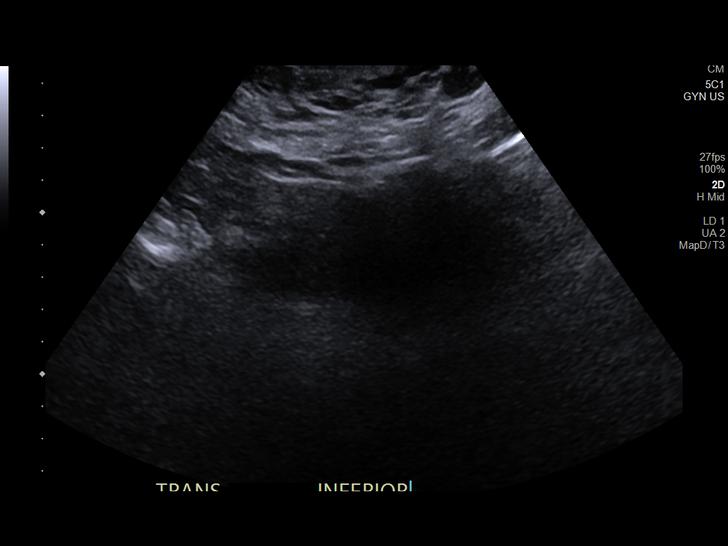
[im 17/65]
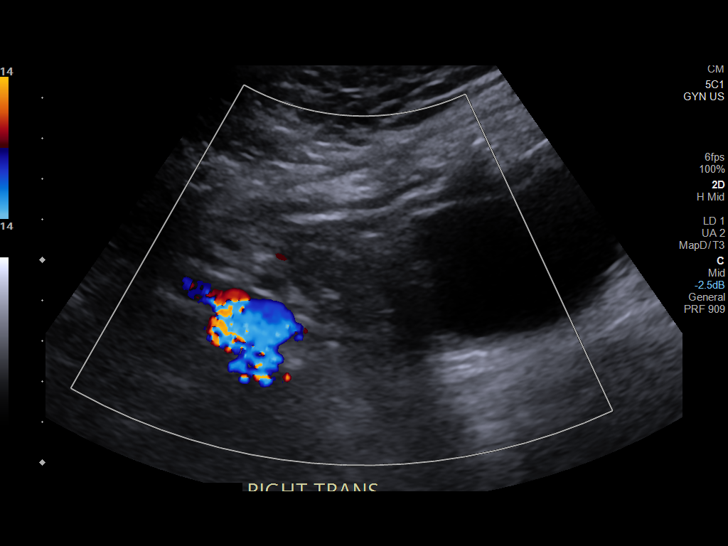
[im 23/65]
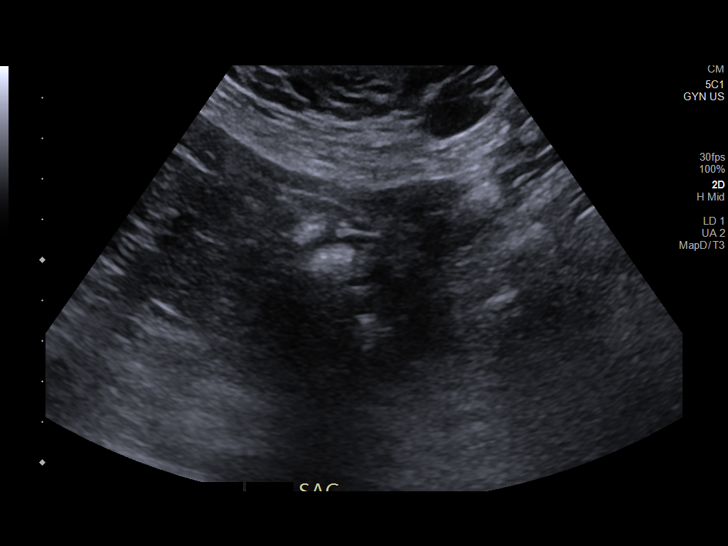
[im 28/65]
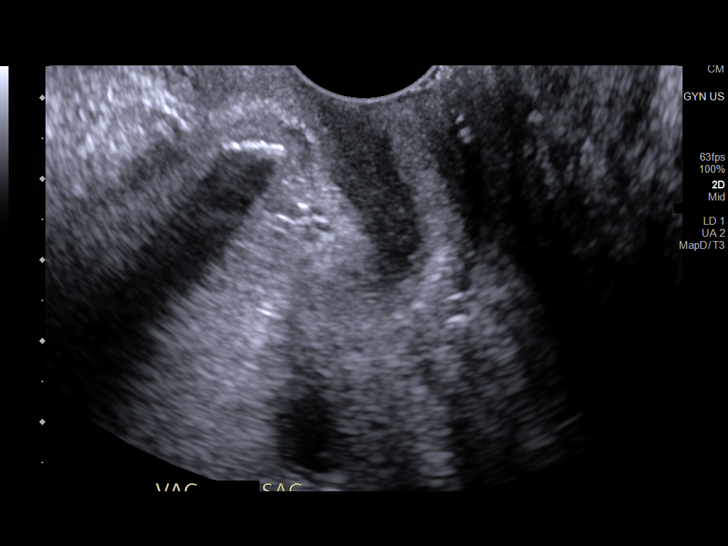
[im 34/65]
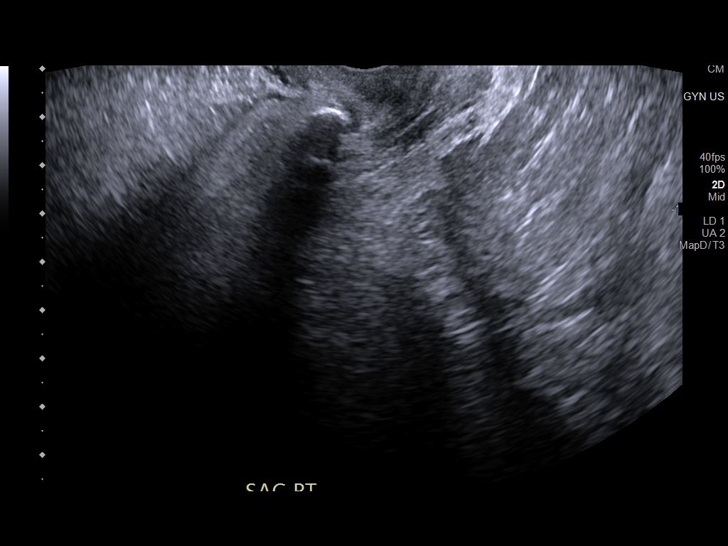
[im 39/65]
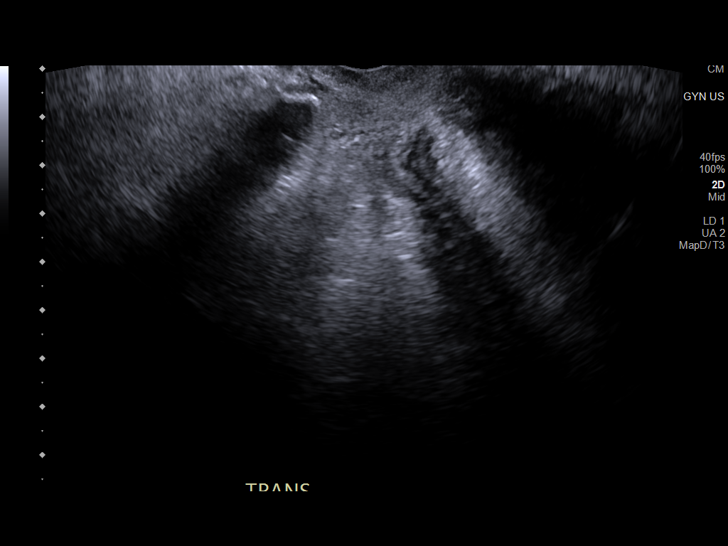
[im 45/65]
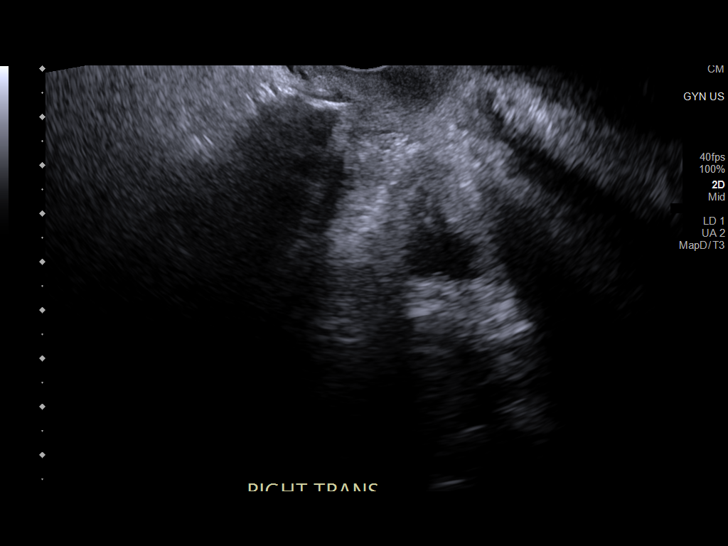
[im 51/65]
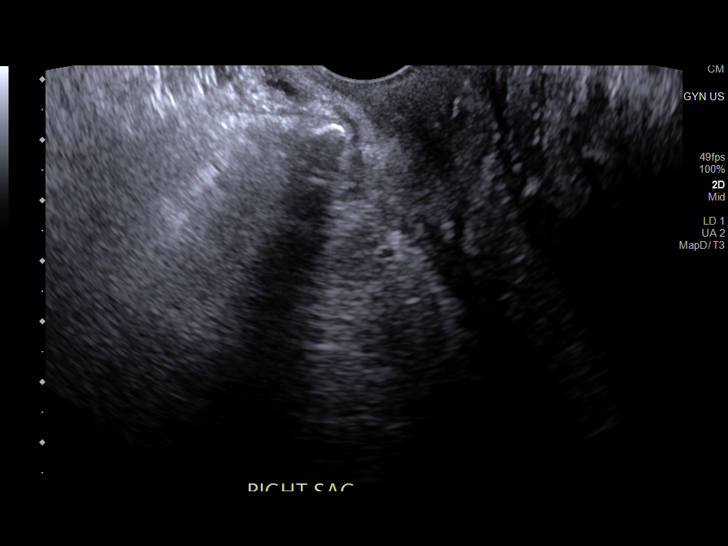
[im 56/65]
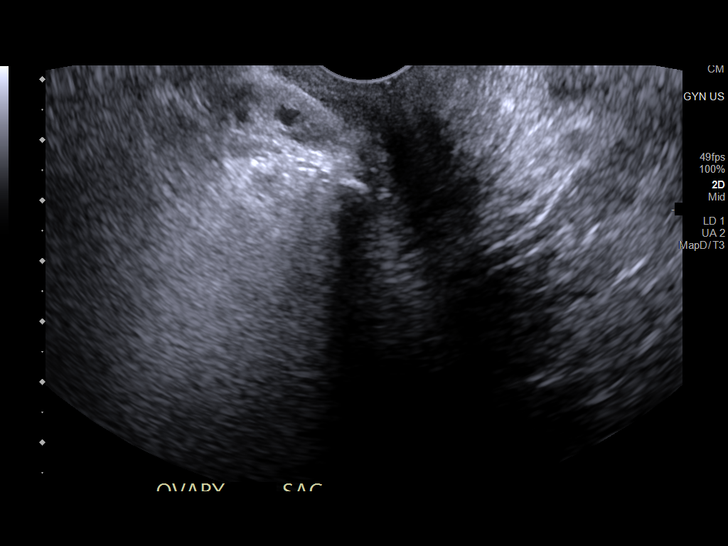
[im 62/65]
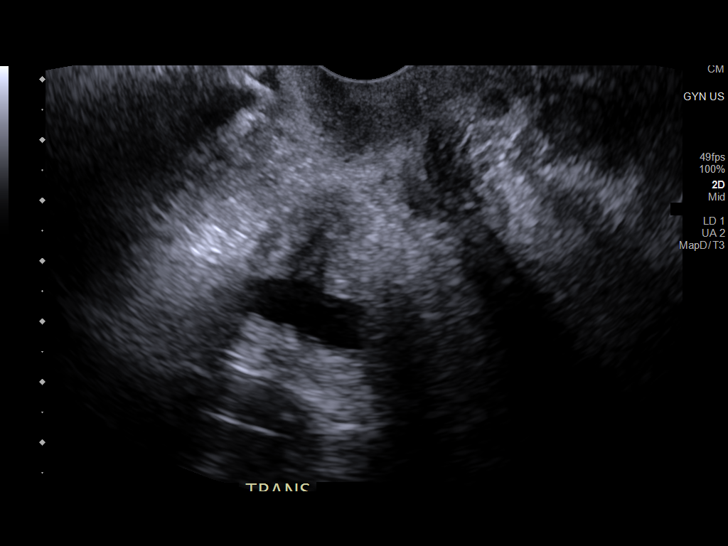

[Series 1001: gyn us · 1 of 2 slices shown]
[im 1/2]
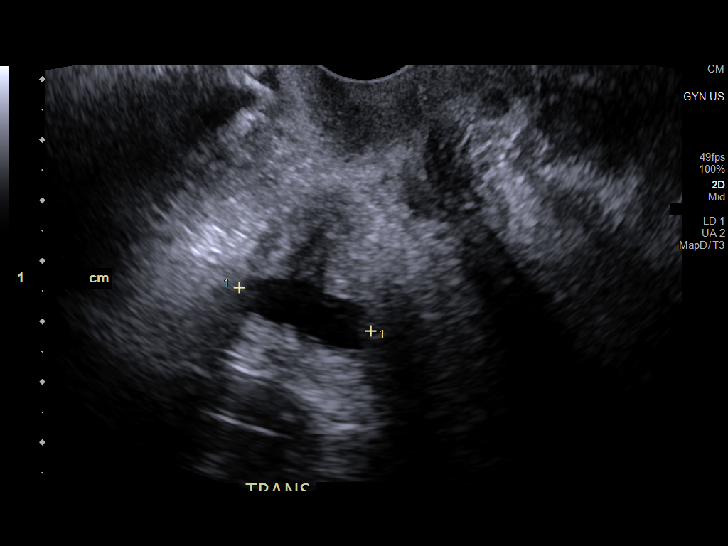

[13 of 25 positions shown; findings below may reference images not displayed]

FINDINGS: Uterus

Prior hysterectomy. On transvaginal imaging, oblong simple anechoic
cystic structure measuring 2.4 x 1.0 x 2.3 cm seen in the region of
the vaginal cuff. No significant internal complexity, vascularity,
or nodularity. Unclear whether this reflects a small collection
versus the incompletely empty bladder.

Endometrium

Surgically absent.

Right ovary

Not visualized. No visible right adnexal mass evident by sonography.
No finding seen to correlate with the lesion seen on recent CT.
Simple anechoic cystic structure at the mid pelvis seen on
transvaginal sequences measures smaller at approximately 2.4 cm in
length with slightly trabeculated margins, and is favored to reflect
the incompletely emptied bladder.

Left ovary

Not visualized.  No other adnexal mass.

Other findings

No abnormal free fluid.
IMPRESSION: 1. 2.4 x 1.0 x 2.3 cm cystic structure in the region of the vaginal
cuff at the mid pelvis, indeterminate, and is unclear whether this
reflects a small collection versus the incompletely emptied bladder.
If this is a collection, a postoperative seroma related to the prior
hysterectomy would be the primary differential consideration. Given
this, further evaluation with dedicated cross-sectional imaging of
the abdomen and pelvis with intravenous contrast is suggested for
further evaluation. In review of the patient's chart, the patient
appears to be scheduled for an MRI of the abdomen on [DATE].
Adjusting this order to include an MRI of the pelvis, with and
without contrast, is suggested to further evaluate these findings.
2. Prior hysterectomy.
3. Nonvisualization of either ovary. No other adnexal mass or free
fluid.

These results will be called to the ordering clinician or
representative by the Radiologist Assistant, and communication
documented in the PACS or [REDACTED].

## 2020-03-07 ENCOUNTER — Other Ambulatory Visit: Payer: Self-pay

## 2020-03-07 ENCOUNTER — Other Ambulatory Visit (HOSPITAL_COMMUNITY): Payer: Self-pay | Admitting: Orthopedic Surgery

## 2020-03-07 ENCOUNTER — Telehealth: Payer: Self-pay

## 2020-03-07 DIAGNOSIS — R9389 Abnormal findings on diagnostic imaging of other specified body structures: Secondary | ICD-10-CM

## 2020-03-07 NOTE — Telephone Encounter (Signed)
Veronica Wiley, see result note.

## 2020-03-07 NOTE — Telephone Encounter (Signed)
See pelvic US result note.   I will prescribe Valium 2 mg x 1 for her to take 45 minutes prior to MRI only if she is able to have a driver.

## 2020-03-07 NOTE — Telephone Encounter (Signed)
Received call report from [place] at [facility]  IMPRESSION: 1. 2.4 x 1.0 x 2.3 cm cystic structure in the region of the vaginal cuff at the mid pelvis, indeterminate, and is unclear whether this reflects a small collection versus the incompletely emptied bladder. If this is a collection, a postoperative seroma related to the prior hysterectomy would be the primary differential consideration. Given this, further evaluation with dedicated cross-sectional imaging of the abdomen and pelvis with intravenous contrast is suggested for further evaluation. In review of the patient's chart, the patient appears to be scheduled for an MRI of the abdomen on 03/16/2020. Adjusting this order to include an MRI of the pelvis, with and without contrast, is suggested to further evaluate these findings. 2. Prior hysterectomy. 3. Nonvisualization of either ovary. No other adnexal mass or free fluid.

## 2020-03-07 NOTE — Telephone Encounter (Signed)
Left detailed message regarding Rx sent in

## 2020-03-07 NOTE — Telephone Encounter (Signed)
Will address this today. See result note.

## 2020-03-07 NOTE — Telephone Encounter (Signed)
Noted  

## 2020-03-16 ENCOUNTER — Other Ambulatory Visit (HOSPITAL_COMMUNITY): Payer: Medicare Other

## 2020-03-20 ENCOUNTER — Telehealth: Payer: Self-pay | Admitting: Internal Medicine

## 2020-03-20 ENCOUNTER — Other Ambulatory Visit: Payer: Self-pay | Admitting: Gastroenterology

## 2020-03-20 DIAGNOSIS — F419 Anxiety disorder, unspecified: Secondary | ICD-10-CM

## 2020-03-20 MED ORDER — DIAZEPAM 2 MG PO TABS: ORAL_TABLET | ORAL | 0 refills | Status: DC

## 2020-03-20 NOTE — Telephone Encounter (Signed)
Spoke with patient. She has a driver for tomorrow. Will prescribe Valium 2 mg x1 to be taken 45 minutes prior to MRI.

## 2020-03-20 NOTE — Telephone Encounter (Signed)
Noted. Faxed to pts pharmacy.

## 2020-03-20 NOTE — Telephone Encounter (Signed)
Rx has been printed. Please fax to pharmacy.

## 2020-03-20 NOTE — Telephone Encounter (Signed)
Pt is scheduled an MRI tomorrow and was requesting something for claustrophobia. Please advise. [phone]

## 2020-03-20 NOTE — Telephone Encounter (Signed)
Routing to Kristen Harper, PA. Please advise.  

## 2020-03-21 ENCOUNTER — Ambulatory Visit (HOSPITAL_COMMUNITY)
Admission: RE | Admit: 2020-03-21 | Discharge: 2020-03-21 | Disposition: A | Discharge status: Home / Self Care | Payer: Medicare Other | Source: Ambulatory Visit | Attending: Gastroenterology | Admitting: Gastroenterology

## 2020-03-21 ENCOUNTER — Other Ambulatory Visit: Payer: Self-pay

## 2020-03-21 DIAGNOSIS — K769 Liver disease, unspecified: Secondary | ICD-10-CM | POA: Insufficient documentation

## 2020-03-21 DIAGNOSIS — R9389 Abnormal findings on diagnostic imaging of other specified body structures: Secondary | ICD-10-CM | POA: Diagnosis present

## 2020-03-21 DIAGNOSIS — K7460 Unspecified cirrhosis of liver: Secondary | ICD-10-CM

## 2020-03-21 DIAGNOSIS — C220 Liver cell carcinoma: Secondary | ICD-10-CM

## 2020-03-21 HISTORY — DX: Liver cell carcinoma: C22.0

## 2020-03-21 HISTORY — DX: Unspecified cirrhosis of liver: K74.60

## 2020-03-21 IMAGING — MR MR PELVIS WO/W CM
12 series · 48 of 48 positions shown · IV contrast (gadavist)
Comparison: CT scan [DATE] and pelvic ultrasound from
[DATE]

CLINICAL DATA: Indeterminate right hepatic lobe lesion.
Indeterminate right adnexal lesion.

EXAM:
MRI ABDOMEN AND PELVIS WITHOUT AND WITH CONTRAST
TECHNIQUE: Multiplanar multisequence MR imaging of the abdomen and pelvis was
performed both before and after the administration of intravenous
contrast.
CONTRAST:  10mL GADAVIST GADOBUTROL 1 MMOL/ML IV SOLN

[Series 2: ax tse trig · axial · 5.0mm · 0.94mm/px · z∈[-125,+139]mm · 4 of 45 slices shown]
[im 1/45]
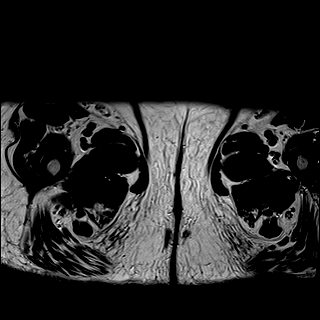
[im 15/45]
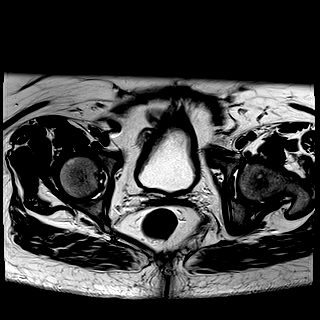
[im 30/45]
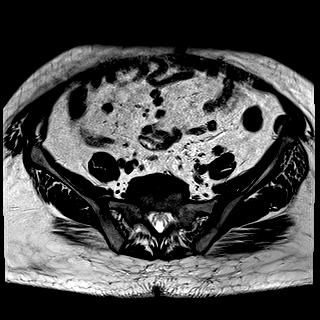
[im 45/45]
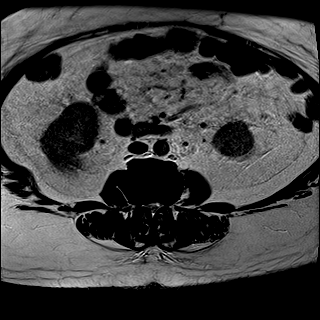

[Series 3: ax tse fs · axial · 5.0mm · 0.94mm/px · z∈[-125,+139]mm · 4 of 45 slices shown]
[im 1/45]
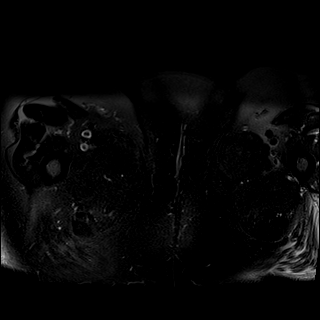
[im 15/45]
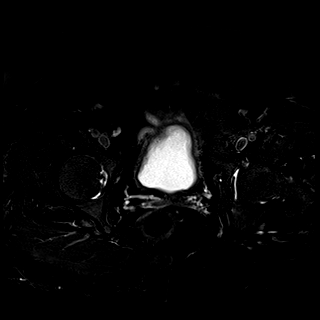
[im 30/45]
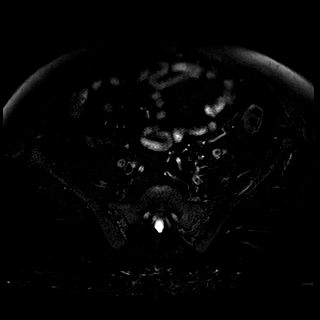
[im 45/45]
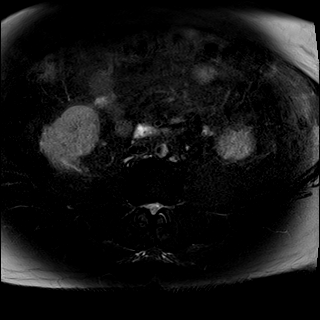

[Series 4: sag tse trig · sagittal · 5.0mm · 0.75mm/px · 2 of 33 slices shown]
[im 1/33]
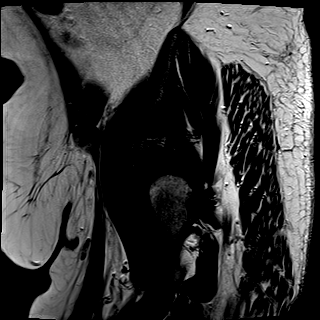
[im 33/33]
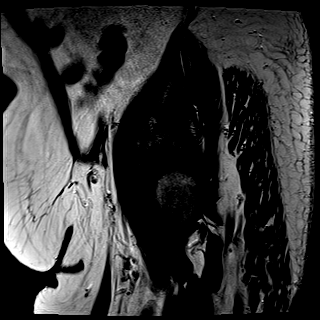

[Series 5: ax in and · axial · 3.0mm · 1.19mm/px · z∈[-124,+137]mm · 5 of 88 slices shown (1 of 2)]
[im 1/88]
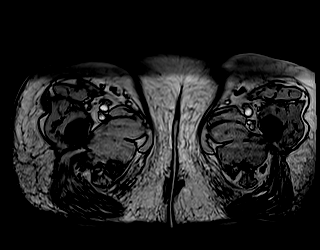
[im 22/88]
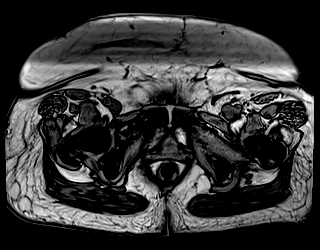
[im 44/88]
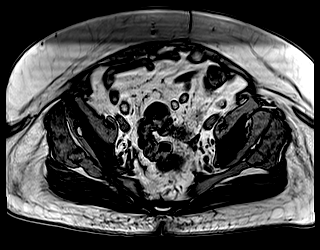
[im 66/88]
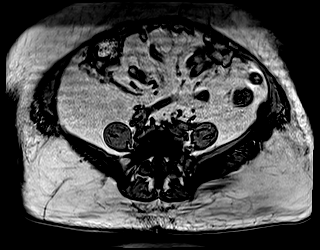
[im 88/88]
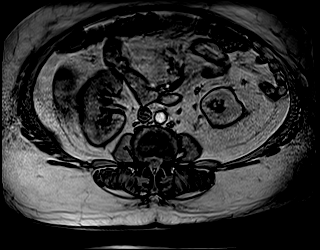

[Series 6: ax in and · axial · 3.0mm · 1.19mm/px · z∈[-124,+137]mm · 5 of 88 slices shown (2 of 2)]
[im 1/88]
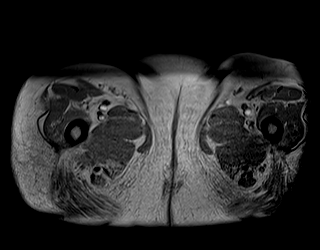
[im 22/88]
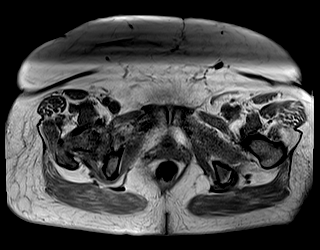
[im 44/88]
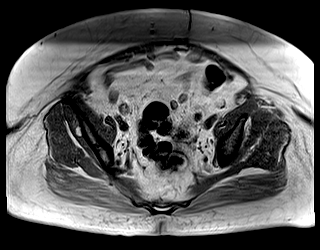
[im 66/88]
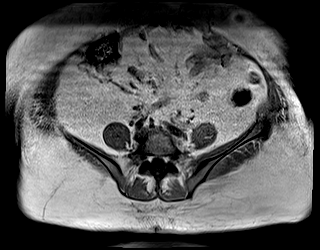
[im 88/88]
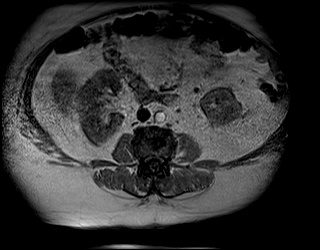

[Series 7: T1 dynamic · axial · non-contrast · 3.0mm · 1.19mm/px · z∈[-124,+137]mm · 5 of 88 slices shown]
[im 1/88]
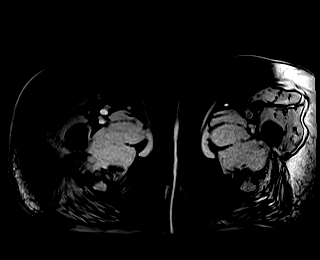
[im 22/88]
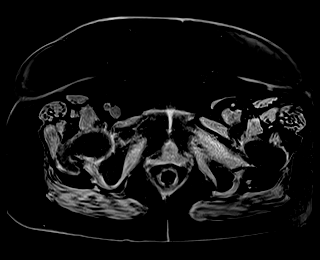
[im 44/88]
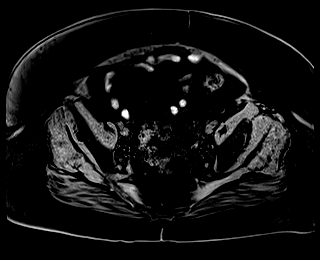
[im 66/88]
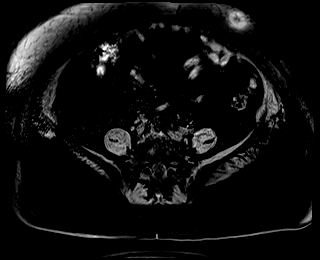
[im 88/88]
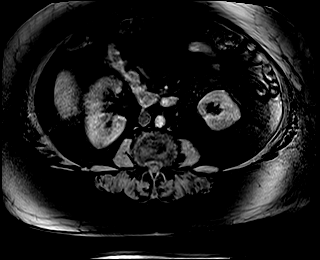

[Series 8: T1 dynamic post-contrast · axial · 3.0mm · 1.19mm/px · z∈[-124,+137]mm · 5 of 88 slices shown (1 of 4)]
[im 1/88]
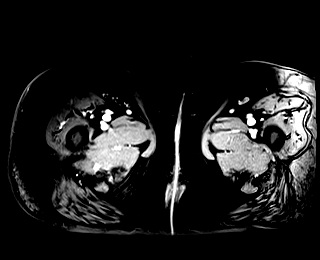
[im 22/88]
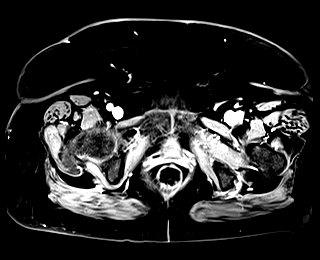
[im 44/88]
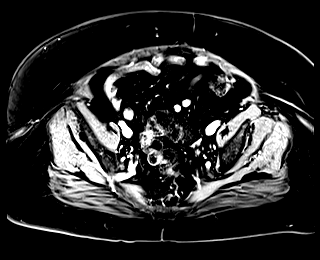
[im 66/88]
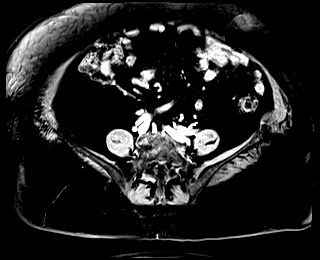
[im 88/88]
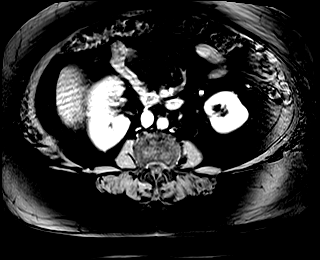

[Series 9: T1 dynamic post-contrast · axial · 3.0mm · 1.19mm/px · z∈[-124,+137]mm · 5 of 88 slices shown (2 of 4)]
[im 1/88]
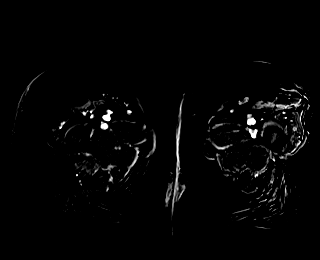
[im 22/88]
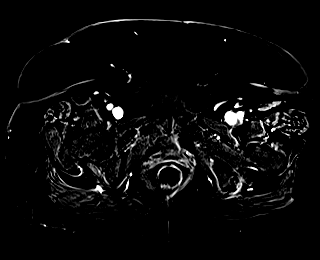
[im 44/88]
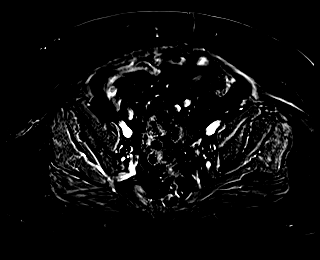
[im 66/88]
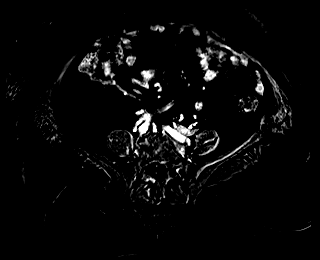
[im 88/88]
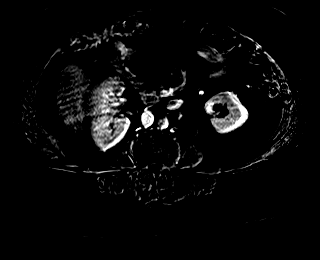

[Series 10: T1 dynamic post-contrast · coronal · 3.0mm · 1.41mm/px · 5 of 80 slices shown (3 of 4)]
[im 1/80]
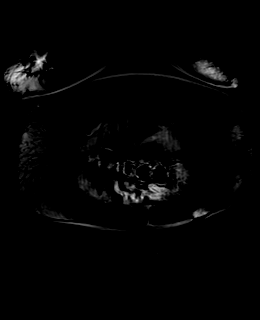
[im 20/80]
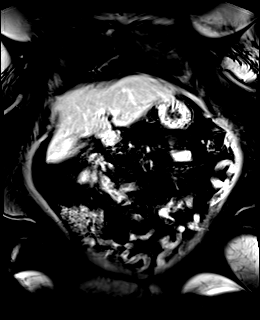
[im 40/80]
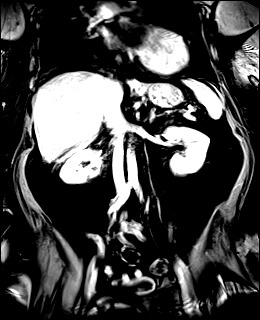
[im 60/80]
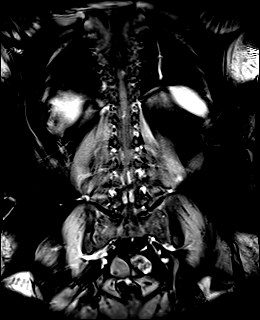
[im 80/80]
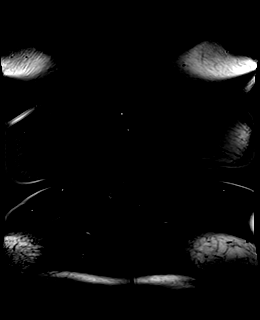

[Series 11: T1 dynamic post-contrast · coronal · 3.0mm · 1.09mm/px · 4 of 64 slices shown (4 of 4)]
[im 1/64]
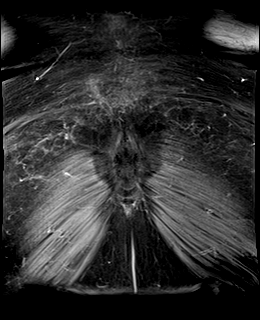
[im 22/64]
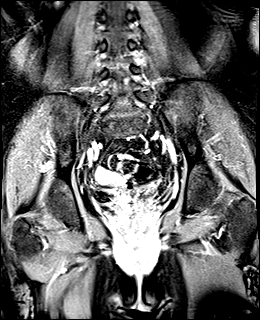
[im 43/64]
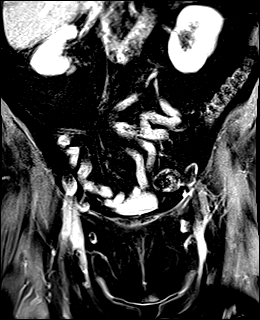
[im 64/64]
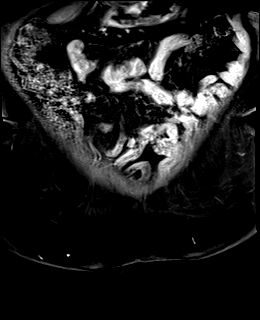

[Series 12: T2 post-contrast · sagittal · 5.0mm · 0.94mm/px · 2 of 33 slices shown]
[im 1/33]
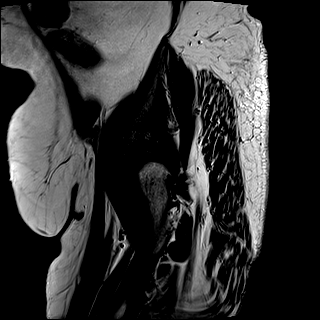
[im 33/33]
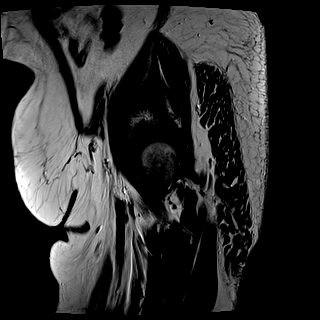

[Series 100: T2 · coronal · 5.0mm · 1.41mm/px · 2 of 36 slices shown]
[im 1/36]
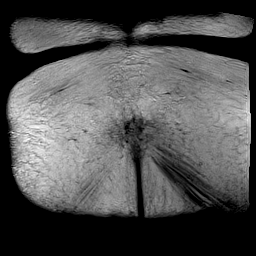
[im 36/36]
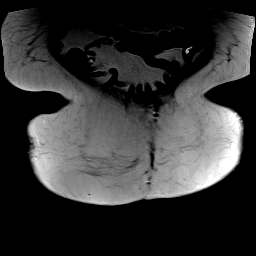

[48 of 48 positions shown; findings below may reference images not displayed]

FINDINGS: COMBINED FINDINGS FOR BOTH MR ABDOMEN AND PELVIS

Lower chest: Mild bandlike scarring in the left lower lobe. Mild
cardiomegaly. Suspected scarring in the right breast.

Hepatobiliary: A heterogeneous lesion with primarily high T2 and low
T1 signal characteristics in the right hepatic lobe measures about
5.3 by 3.8 by 5.7 cm. This has some ill definition of margins and
the T2 signal hyperintensity is less intense in the posterior half
of the lesion. This lesion demonstrates arterial phase enhancement
which persists, along with a hypoenhancing anterior portion
measuring about 1.7 by 1.2 cm. There is also a small adjacent lesion
measuring 0.8 by 0.4 cm in the subcapsular right hepatic lobe
anterior to this dominant lesion, which demonstrates reducing
conspicuity on delayed phase images. Appearance raises concern for
malignancy such as cholangiocarcinoma or hepatocellular carcinoma,
although hyalinized/atypical hemangioma can have a similar
appearance. Tissue diagnosis recommended.

Small filling defects in the gallbladder compatible with gallstones
for example on image 17 of series 8. No biliary dilatation.

Pancreas:  Unremarkable

Spleen:  Unremarkable

Adrenals/Urinary Tract:  Unremarkable

Stomach/Bowel: Unremarkable

Vascular/Lymphatic: Aortoiliac atherosclerotic vascular disease.
Upper normal sized porta hepatis lymph nodes but not pathologically
enlarged.

Reproductive: Recent ultrasound showed a potentially complex cystic
lesion along the right vaginal cuff, in the setting of prior
hysterectomy. On today's MRI, we demonstrate a 3.9 by 2.0 by 2.6 cm
complex structure resembling right ovary prior to contrast
administration, but with internal hypoenhancement suggesting a
complex cystic lesion. This abuts the right upper margin of the
vaginal cuff and also abuts adjacent loops of nondilated small bowel
limited enhancement along the margin of this lesion based on the
subtraction images.

Other:  No supplemental non-categorized findings.

Musculoskeletal: Lumbar spondylosis and degenerative disc disease.
IMPRESSION: 1. Although possibly benign entity such as hyalinized hemangioma,
the 5.3 by 3.8 by 5.7 cm has complex enhancement characteristics and
could represent hepatocellular carcinoma or cholangiocarcinoma.
Biopsy/tissue diagnosis is recommended.
2. Complex cystic lesion along the right side of the vaginal cuff
measuring 3.9 by 2.0 by 2.6 cm. This has heterogeneous T2 signal but
only mild marginal enhancement. This is considered a cyst with
indeterminate characteristics and based on current guidelines,
surgical evaluation should be considered. If surgical evaluation is
not elected, sonographic surveillance would be recommended.
3. Cholelithiasis.
4. Lumbar spondylosis and degenerative disc disease.
5.  Aortic Atherosclerosis ([6O]-[6O]).
6. Mild cardiomegaly.

## 2020-03-21 IMAGING — MR MR ABDOMEN WO/W CM
20 series · 48 of 48 positions shown · IV contrast (10 ml Gadavist)
Comparison: CT scan [DATE] and pelvic ultrasound from
[DATE]

CLINICAL DATA: Indeterminate right hepatic lobe lesion.
Indeterminate right adnexal lesion.

EXAM:
MRI ABDOMEN AND PELVIS WITHOUT AND WITH CONTRAST
TECHNIQUE: Multiplanar multisequence MR imaging of the abdomen and pelvis was
performed both before and after the administration of intravenous
contrast.
CONTRAST:  10mL GADAVIST GADOBUTROL 1 MMOL/ML IV SOLN

[Series 5: cor haste · coronal · 6.0mm · 1.25mm/px · 1 of 38 slices shown]
[im 1/38]
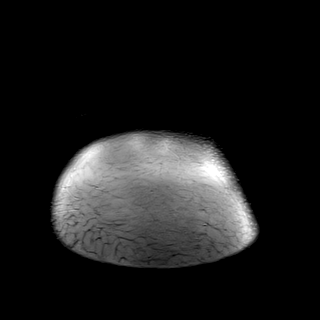

[Series 6: ax haste · axial · 6.0mm · 1.41mm/px · 1 of 35 slices shown]
[im 1/35]
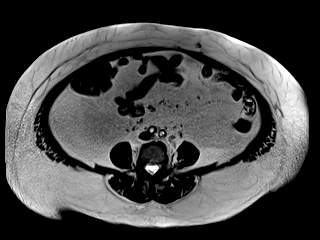

[Series 8: T2 fat-sat · axial · 6.0mm · 1.19mm/px · 1 of 32 slices shown]
[im 1/32]
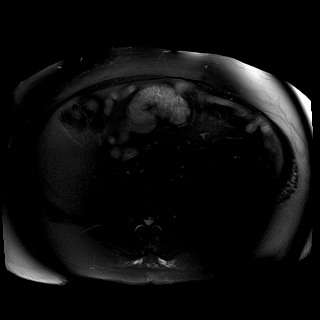

[Series 9: DWI · axial · 6.0mm · 1.68mm/px · 1 of 42 slices shown (1 of 4)]
[im 1/42]
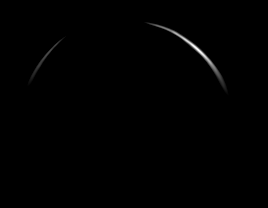

[Series 9: DWI · axial · 6.0mm · 1.68mm/px · z∈[+72,+368]mm · 2 of 42 slices shown (2 of 4)]
[im 1/42]
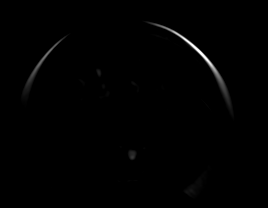
[im 42/42]
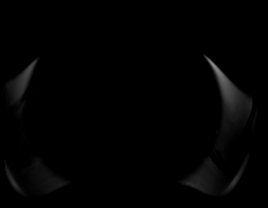

[Series 9: DWI · axial · 6.0mm · 1.68mm/px · z∈[+72,+368]mm · 2 of 42 slices shown (3 of 4)]
[im 1/42]
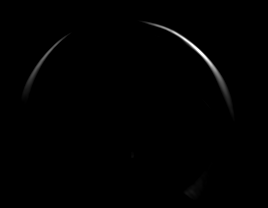
[im 42/42]
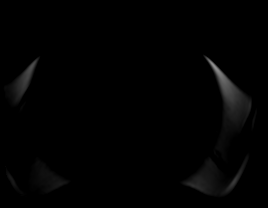

[Series 10: DWI · axial · 6.0mm · 1.68mm/px · z∈[+72,+368]mm · 2 of 42 slices shown (4 of 4)]
[im 1/42]
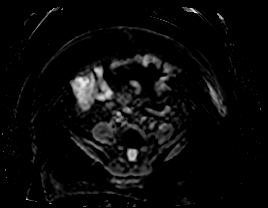
[im 42/42]
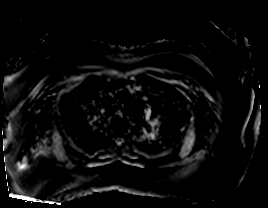

[Series 11: bSSFP · axial · 6.0mm · 0.88mm/px · z∈[+103,+337]mm · 2 of 40 slices shown]
[im 1/40]
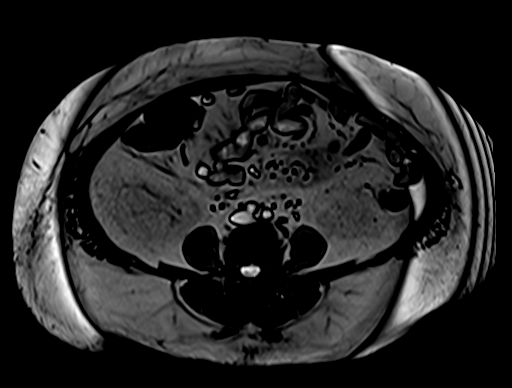
[im 40/40]
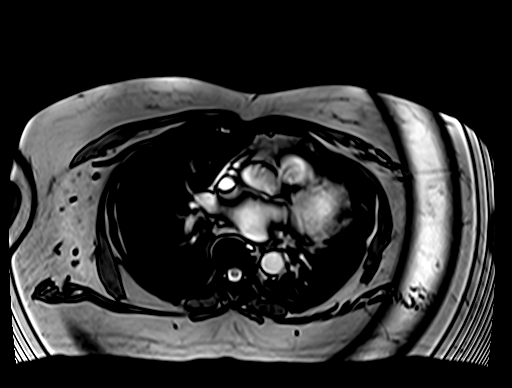

[Series 12: ax in and · axial · 3.0mm · 1.33mm/px · z∈[+107,+344]mm · 3 of 80 slices shown (1 of 2)]
[im 1/80]
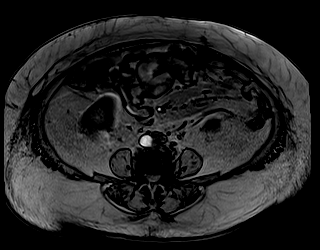
[im 40/80]
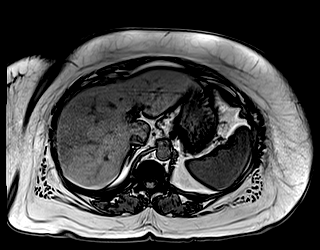
[im 80/80]
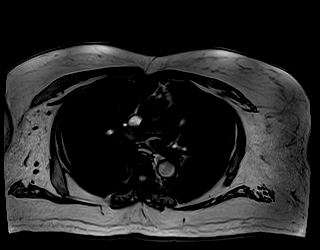

[Series 13: ax in and · axial · 3.0mm · 1.33mm/px · z∈[+107,+344]mm · 3 of 80 slices shown (2 of 2)]
[im 1/80]
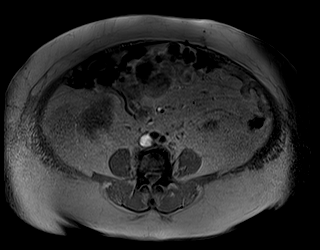
[im 40/80]
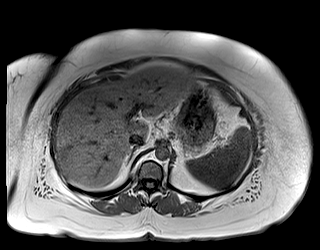
[im 80/80]
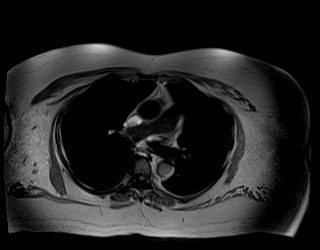

[Series 14: T1 dynamic · axial · non-contrast · 3.0mm · 1.33mm/px · z∈[+119,+332]mm · 3 of 72 slices shown (1 of 4)]
[im 1/72]
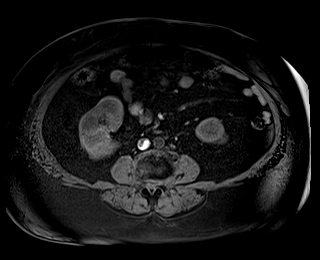
[im 36/72]
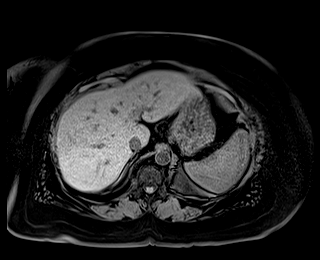
[im 72/72]
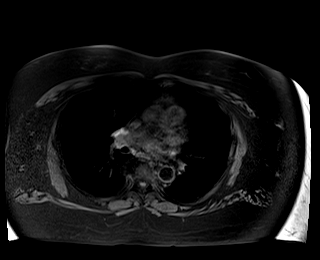

[Series 16: T1 dynamic post-contrast · axial · 3.0mm · 1.33mm/px · z∈[+119,+332]mm · 3 of 72 slices shown (1 of 6)]
[im 1/72]
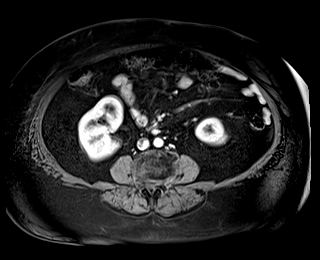
[im 36/72]
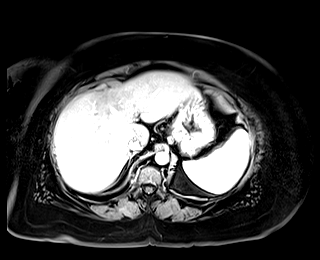
[im 72/72]
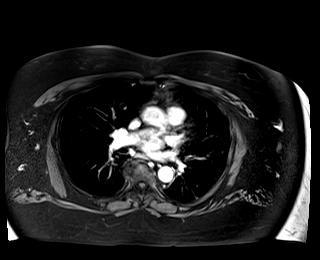

[Series 17: T1 dynamic · axial · 3.0mm · 1.33mm/px · z∈[+119,+332]mm · 3 of 72 slices shown (2 of 4)]
[im 1/72]
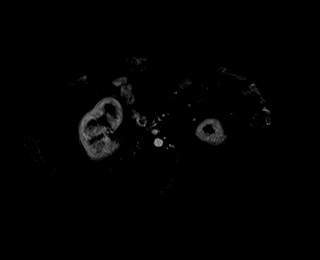
[im 36/72]
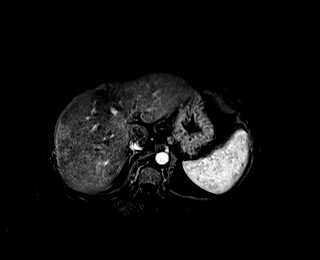
[im 72/72]
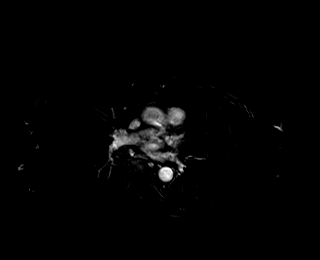

[Series 18: T1 dynamic post-contrast · axial · 3.0mm · 1.33mm/px · z∈[+119,+332]mm · 3 of 72 slices shown (2 of 6)]
[im 1/72]
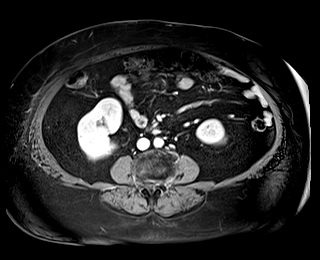
[im 36/72]
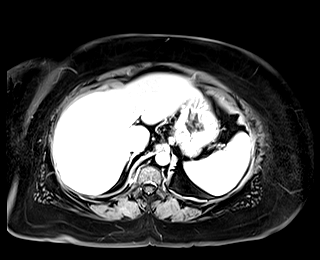
[im 72/72]
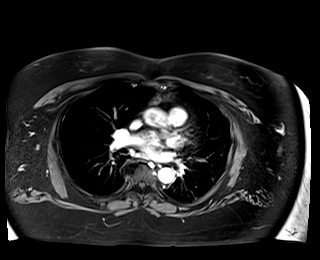

[Series 19: T1 dynamic · axial · 3.0mm · 1.33mm/px · z∈[+119,+332]mm · 3 of 72 slices shown (3 of 4)]
[im 1/72]
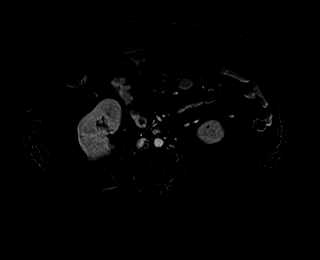
[im 36/72]
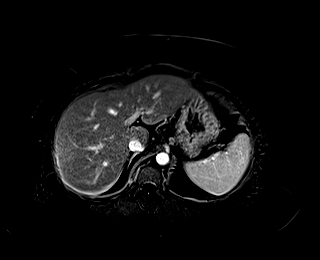
[im 72/72]
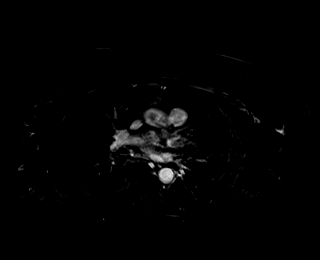

[Series 20: T1 dynamic post-contrast · axial · 3.0mm · 1.33mm/px · z∈[+119,+332]mm · 3 of 72 slices shown (3 of 6)]
[im 1/72]
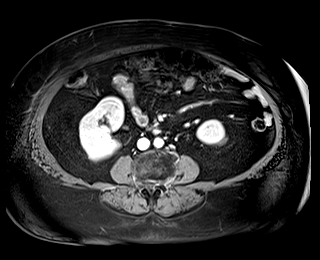
[im 36/72]
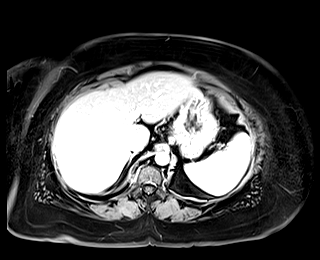
[im 72/72]
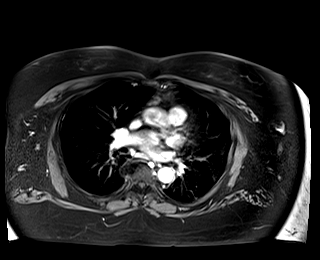

[Series 21: T1 dynamic · axial · 3.0mm · 1.33mm/px · z∈[+119,+332]mm · 3 of 72 slices shown (4 of 4)]
[im 1/72]
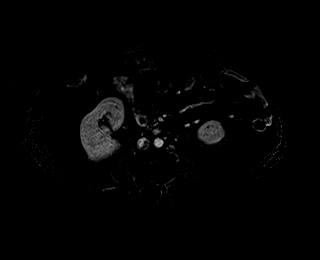
[im 36/72]
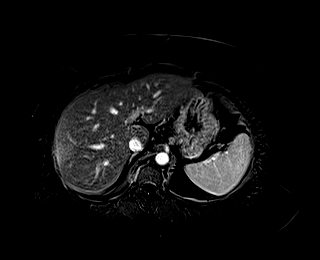
[im 72/72]
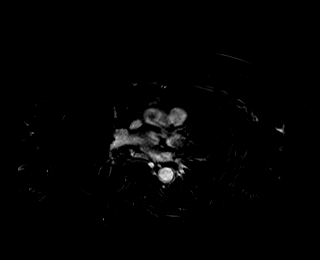

[Series 22: T1 dynamic post-contrast · coronal · 3.0mm · 1.41mm/px · 3 of 80 slices shown (4 of 6)]
[im 1/80]
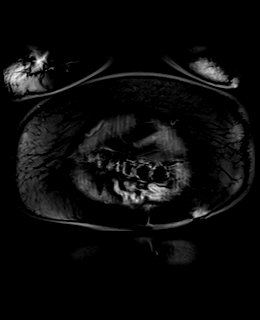
[im 40/80]
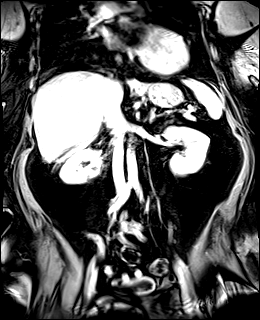
[im 80/80]
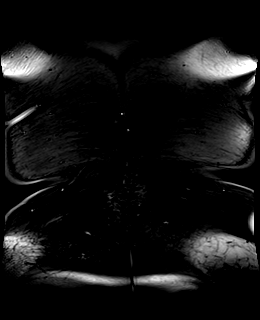

[Series 23: T1 dynamic post-contrast · axial · 3.0mm · 1.33mm/px · z∈[+119,+332]mm · 3 of 72 slices shown (5 of 6)]
[im 1/72]
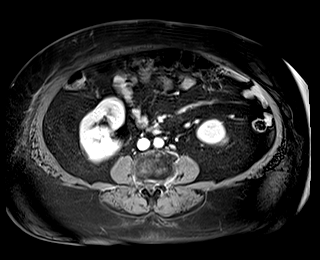
[im 36/72]
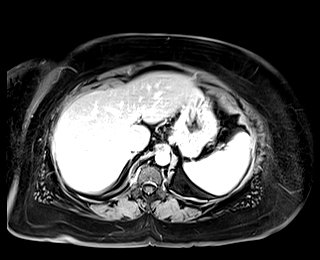
[im 72/72]
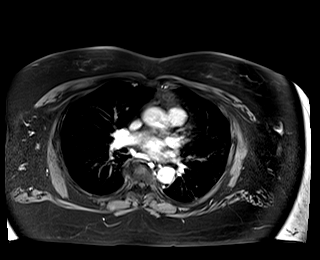

[Series 24: T1 dynamic post-contrast · axial · 3.0mm · 1.33mm/px · z∈[+119,+332]mm · 3 of 72 slices shown (6 of 6)]
[im 1/72]
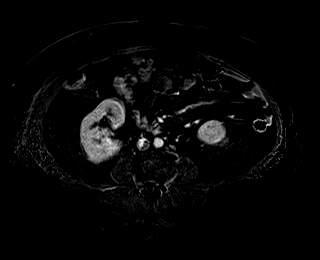
[im 36/72]
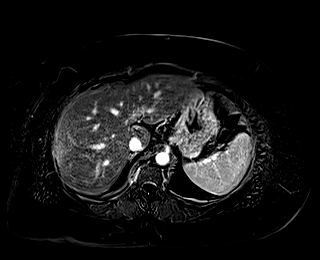
[im 72/72]
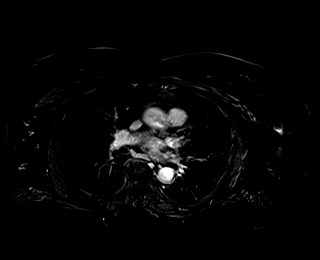

[48 of 48 positions shown; findings below may reference images not displayed]

FINDINGS: COMBINED FINDINGS FOR BOTH MR ABDOMEN AND PELVIS

Lower chest: Mild bandlike scarring in the left lower lobe. Mild
cardiomegaly. Suspected scarring in the right breast.

Hepatobiliary: A heterogeneous lesion with primarily high T2 and low
T1 signal characteristics in the right hepatic lobe measures about
5.3 by 3.8 by 5.7 cm. This has some ill definition of margins and
the T2 signal hyperintensity is less intense in the posterior half
of the lesion. This lesion demonstrates arterial phase enhancement
which persists, along with a hypoenhancing anterior portion
measuring about 1.7 by 1.2 cm. There is also a small adjacent lesion
measuring 0.8 by 0.4 cm in the subcapsular right hepatic lobe
anterior to this dominant lesion, which demonstrates reducing
conspicuity on delayed phase images. Appearance raises concern for
malignancy such as cholangiocarcinoma or hepatocellular carcinoma,
although hyalinized/atypical hemangioma can have a similar
appearance. Tissue diagnosis recommended.

Small filling defects in the gallbladder compatible with gallstones
for example on image 17 of series 8. No biliary dilatation.

Pancreas:  Unremarkable

Spleen:  Unremarkable

Adrenals/Urinary Tract:  Unremarkable

Stomach/Bowel: Unremarkable

Vascular/Lymphatic: Aortoiliac atherosclerotic vascular disease.
Upper normal sized porta hepatis lymph nodes but not pathologically
enlarged.

Reproductive: Recent ultrasound showed a potentially complex cystic
lesion along the right vaginal cuff, in the setting of prior
hysterectomy. On today's MRI, we demonstrate a 3.9 by 2.0 by 2.6 cm
complex structure resembling right ovary prior to contrast
administration, but with internal hypoenhancement suggesting a
complex cystic lesion. This abuts the right upper margin of the
vaginal cuff and also abuts adjacent loops of nondilated small bowel
limited enhancement along the margin of this lesion based on the
subtraction images.

Other:  No supplemental non-categorized findings.

Musculoskeletal: Lumbar spondylosis and degenerative disc disease.
IMPRESSION: 1. Although possibly benign entity such as hyalinized hemangioma,
the 5.3 by 3.8 by 5.7 cm has complex enhancement characteristics and
could represent hepatocellular carcinoma or cholangiocarcinoma.
Biopsy/tissue diagnosis is recommended.
2. Complex cystic lesion along the right side of the vaginal cuff
measuring 3.9 by 2.0 by 2.6 cm. This has heterogeneous T2 signal but
only mild marginal enhancement. This is considered a cyst with
indeterminate characteristics and based on current guidelines,
surgical evaluation should be considered. If surgical evaluation is
not elected, sonographic surveillance would be recommended.
3. Cholelithiasis.
4. Lumbar spondylosis and degenerative disc disease.
5.  Aortic Atherosclerosis ([6O]-[6O]).
6. Mild cardiomegaly.

## 2020-03-21 MED ORDER — GADOBUTROL 1 MMOL/ML IV SOLN
10.0000 mL | Freq: Once | INTRAVENOUS | Status: AC | PRN
  Administered 2020-03-21: 10 mL via INTRAVENOUS

## 2020-03-29 ENCOUNTER — Other Ambulatory Visit: Payer: Self-pay | Admitting: *Deleted

## 2020-03-29 ENCOUNTER — Telehealth: Payer: Self-pay | Admitting: Gastroenterology

## 2020-03-29 ENCOUNTER — Telehealth: Payer: Self-pay

## 2020-03-29 ENCOUNTER — Telehealth: Payer: Self-pay | Admitting: Internal Medicine

## 2020-03-29 DIAGNOSIS — K769 Liver disease, unspecified: Secondary | ICD-10-CM

## 2020-03-29 DIAGNOSIS — K7460 Unspecified cirrhosis of liver: Secondary | ICD-10-CM

## 2020-03-29 DIAGNOSIS — R935 Abnormal findings on diagnostic imaging of other abdominal regions, including retroperitoneum: Secondary | ICD-10-CM

## 2020-03-29 DIAGNOSIS — R9389 Abnormal findings on diagnostic imaging of other specified body structures: Secondary | ICD-10-CM

## 2020-03-29 NOTE — Addendum Note (Signed)
Addended by: Hassan Rowan on: 03/29/2020 01:56 PM   Modules accepted: Orders

## 2020-03-29 NOTE — Telephone Encounter (Signed)
Pt said she was calling to give the nurse info on her OB/Gyn doctor where she can have her biopsy done in [place] instead of [place]. She said it was at [facility] (Dr Su Grand ?) [phone]. Any questions call her at [phone]

## 2020-03-29 NOTE — Telephone Encounter (Signed)
Spoke with patient. She is going to call Morey Hummingbird back to arrange liver biopsy. Also clarified OBGYN referral. She would like to see Dr. Clarice Pole at [facility].

## 2020-03-29 NOTE — Telephone Encounter (Signed)
See previous phone note. This call has been taken care of by [facility].

## 2020-03-29 NOTE — Telephone Encounter (Signed)
Received a staff message from Edwena Bunde who tried to call patient to schedule liver biopsy. Patient requested to have liver biopsy closer to home. She lives in [place]. I have discussed this with Mindy and Tretha Sciara. Unable to arrange liver biopsy in [place]. I have tried calling patient to discuss, but had to leave a VM.   [facility] Clinical Pool or Elmo Putt: If patient calls back, please let her know we are not able to arrange for the liver biopsy in [place]. It can be completed at [facility], or [facility]. To schedule, Morey Hummingbird said to call her back at [phone].  [facility] Clinical Pool: See telephone note from earlier today. Looks like patient called with OBGYN she would like to see.

## 2020-03-29 NOTE — Telephone Encounter (Signed)
Called [facility] (phone [phone]), fax# is [phone].  Referral faxed to [facility] for Dr. Clarice Pole.

## 2020-03-29 NOTE — Telephone Encounter (Signed)
Noted  

## 2020-03-30 ENCOUNTER — Encounter (HOSPITAL_COMMUNITY): Payer: Self-pay

## 2020-03-30 ENCOUNTER — Telehealth: Payer: Self-pay | Admitting: Gastroenterology

## 2020-03-30 NOTE — Telephone Encounter (Signed)
Veronica Wiley, patient is being scheduled for a liver biopsy. Currently scheduled for 12/21. We need to get the ok to hold Plavix x 5 days. I am not sure who is prescribing Plavix for her. It may be her PCP. Can you reach out to patient to determine who is prescribing the medication, then reach out to the provider to get the ok to hold Plavix. Her last dose will need to be on 12/15 if we are able to keep the biopsy as scheduled.

## 2020-03-30 NOTE — Progress Notes (Unsigned)
Veronica Wiley Female, 74 y.o., 06/03/1945  MRN:  [id number] Phone:  [phone] [name])       PCP:  Michell Heinrich, DO Primary Cvg:  Medicare/Medicare Part A And B  Next Appt With Radiology ([facility]-US 2) 04/10/2020 at 1:00 PM           RE: Biopsy Received: Yesterday  Message Details  Arne Cleveland, MD  Ernestene Mention   [place] core biopsy liver lesion    DDH    Previous Messages  ----- Message -----  From: [name]  Sent: 03/29/2020  9:19 AM EST  To: Ir Procedure Requests  Subject: Biopsy                      Procedure Requested: US Biopsy (Liver)    Reason for Procedure: Please arrange liver biopsy. Diagnosis: Indeterminate liver lesion on MRI with differentials including hepatocellular carcinoma, cholangiocarcinoma, or possible hyalinized hemangioma    Provider Requesting: Erenest Rasher  Provider Telephone: [phone]    Other Info:

## 2020-04-02 NOTE — Telephone Encounter (Signed)
Spoke with pt. Pt thinks Dr. Michell Heinrich can help with any questions we have with Plavix. I will contact Dr. Marveen Reeks about holding medication for 5 day prior to procedure.   Pt also said she received a call from the OBGYN that our office referred her to, stating that they don't handle the services needed for this pt. (Routing to [facility] and [name], [place])

## 2020-04-03 ENCOUNTER — Other Ambulatory Visit: Payer: Self-pay

## 2020-04-03 ENCOUNTER — Telehealth: Payer: Self-pay | Admitting: Internal Medicine

## 2020-04-03 DIAGNOSIS — R935 Abnormal findings on diagnostic imaging of other abdominal regions, including retroperitoneum: Secondary | ICD-10-CM

## 2020-04-03 NOTE — Telephone Encounter (Signed)
We can try referring ASAP to somewhere locally. Will let [facility] work on this.

## 2020-04-03 NOTE — Telephone Encounter (Signed)
Noted. Referral canceled

## 2020-04-03 NOTE — Telephone Encounter (Signed)
Noted  

## 2020-04-03 NOTE — Telephone Encounter (Signed)
Spoke with Dr. Haskell Riling office. Letter was faxed asking to hold Plavix prior to liver Bx. Dr. Haskell Riling office will contact our office no later than tomorrow with a response.

## 2020-04-03 NOTE — Progress Notes (Signed)
Opened in error

## 2020-04-03 NOTE — Telephone Encounter (Signed)
Noted. [facility]

## 2020-04-03 NOTE — Telephone Encounter (Signed)
Pt called to say that we referred her to a OBGYN and they can't get to her until July 2022. She is wanting another OBGYN so she can get seen sooner. Please advise. [phone]

## 2020-04-04 ENCOUNTER — Encounter (HOSPITAL_COMMUNITY): Payer: Self-pay

## 2020-04-04 NOTE — Telephone Encounter (Signed)
Noted. Spoke with patient and verified that she did receive a call from Dr. Marveen Reeks' office and will be holding plavix until after her liver biopsy.

## 2020-04-04 NOTE — Telephone Encounter (Signed)
Spoke with Sonia Baller from Dr. Haskell Riling office. They have already called pt this morning and asked her to d/c the Plavix today and resume after the biopsy.

## 2020-04-04 NOTE — Progress Notes (Signed)
Veronica Wiley Female, 74 y.o., [birth year]  MRN:  [id number] Phone:  [phone] [name])       PCP:  Michell Heinrich, DO Primary Cvg:  Medicare/Medicare Part A And B  Next Appt With Radiology ([facility]-US 2) 04/10/2020 at 1:00 PM           RE: Biopsy Received: Today  Message Details  [name]  [name] Received call from Dr. Marveen Reeks' office this morning. Dr. Marveen Reeks had his nurse call patient this morning advising she go ahead and hold plavix and resume after her liver biopsy.   [name], PA-C  [facility]    Previous Messages  ----- Message -----  From: [name]  Sent: 04/03/2020  9:05 AM EST  To: Erenest Rasher, PA-C  Subject: RE: Biopsy                    Hello PA-C [name]   Have you spoken with patient [name]'s Dr who prescribed  Her blood thinners?   I reached out to Dr Marveen Reeks, the name patient gave, but have not received a  Response.   [name]  ----- Message -----  From: [name]  Sent: 03/30/2020  4:53 PM EST  To: [name]  Subject: RE: Biopsy                    I will reach out to the provider who prescribes this to get the ok to hold Plavix x5 days. .   Thanks,  [name], PA-C  [facility]    ----- Message -----  From: [name]  Sent: 03/30/2020  3:36 PM EST  To: Erenest Rasher, PA-C  Subject: RE: Biopsy                    Hello PA-C [name]   Patient [name] is on Plavix  She needs to hold it five days prior to biopsy.  Permission is needed.   Her last dose needs to be December 15.   [name]  ----- Message -----  From: Arne Cleveland, MD  Sent: 03/29/2020  9:58 AM EST  To: [name]  Subject: RE: Biopsy                    Ok   [place] core biopsy liver lesion    DDH     ----- Message -----  From: [name]  Sent: 03/29/2020  9:19 AM EST  To: Ir Procedure Requests  Subject: Biopsy                      Procedure Requested: US Biopsy (Liver)    Reason for Procedure: Please arrange liver biopsy. Diagnosis: Indeterminate liver lesion on MRI with differentials including hepatocellular carcinoma, cholangiocarcinoma, or possible hyalinized hemangioma    Provider Requesting: Erenest Rasher  Provider Telephone: [phone]

## 2020-04-06 ENCOUNTER — Telehealth: Payer: Self-pay | Admitting: Gastroenterology

## 2020-04-06 NOTE — Telephone Encounter (Signed)
Veronica Wiley called about a referral that was sent and said that they do not have an opening until June 2022, stated that it would be faster probably to get her in elsewhere. Looks like she now has an appointment with dr Elonda Husky

## 2020-04-06 NOTE — Telephone Encounter (Addendum)
Noted. She has been scheduled with Dr. Elonda Husky 04/23/20.

## 2020-04-09 ENCOUNTER — Other Ambulatory Visit: Payer: Self-pay | Admitting: Student

## 2020-04-10 ENCOUNTER — Ambulatory Visit (HOSPITAL_COMMUNITY)
Admission: RE | Admit: 2020-04-10 | Discharge: 2020-04-10 | Disposition: A | Discharge status: Home / Self Care | Payer: Medicare Other | Source: Ambulatory Visit | Attending: Gastroenterology | Admitting: Gastroenterology

## 2020-04-10 ENCOUNTER — Other Ambulatory Visit: Payer: Self-pay

## 2020-04-10 ENCOUNTER — Encounter (HOSPITAL_COMMUNITY): Payer: Self-pay

## 2020-04-10 DIAGNOSIS — R160 Hepatomegaly, not elsewhere classified: Secondary | ICD-10-CM | POA: Diagnosis present

## 2020-04-10 DIAGNOSIS — C220 Liver cell carcinoma: Secondary | ICD-10-CM | POA: Diagnosis not present

## 2020-04-10 DIAGNOSIS — K769 Liver disease, unspecified: Secondary | ICD-10-CM

## 2020-04-10 LAB — CBC
HCT: 37.2 % (ref 36.0–46.0)
Hemoglobin: 11.4 g/dL — ABNORMAL LOW (ref 12.0–15.0)
MCH: 27 pg (ref 26.0–34.0)
MCHC: 30.6 g/dL (ref 30.0–36.0)
MCV: 88.2 fL (ref 80.0–100.0)
Platelets: 361 10*3/uL (ref 150–400)
RBC: 4.22 MIL/uL (ref 3.87–5.11)
RDW: 13.4 % (ref 11.5–15.5)
WBC: 10 10*3/uL (ref 4.0–10.5)
nRBC: 0 % (ref 0.0–0.2)

## 2020-04-10 LAB — PROTIME-INR
INR: 1.1 (ref 0.8–1.2)
Prothrombin Time: 13.5 seconds (ref 11.4–15.2)

## 2020-04-10 LAB — GLUCOSE, CAPILLARY
Glucose-Capillary: 161 mg/dL — ABNORMAL HIGH (ref 70–99)
Glucose-Capillary: 34 mg/dL — CL (ref 70–99)
Glucose-Capillary: 97 mg/dL (ref 70–99)

## 2020-04-10 IMAGING — US US BIOPSY CORE LIVER
1 series · 13 of 16 positions shown · non-contrast
Comparison: None.

INDICATION: 74-year-old female with indeterminate right hepatic mass.

EXAM:
ULTRASOUND GUIDED LIVER LESION BIOPSY

[Series 1: us biopsy (liver) · 13 of 16 slices shown]
[im 1/16]
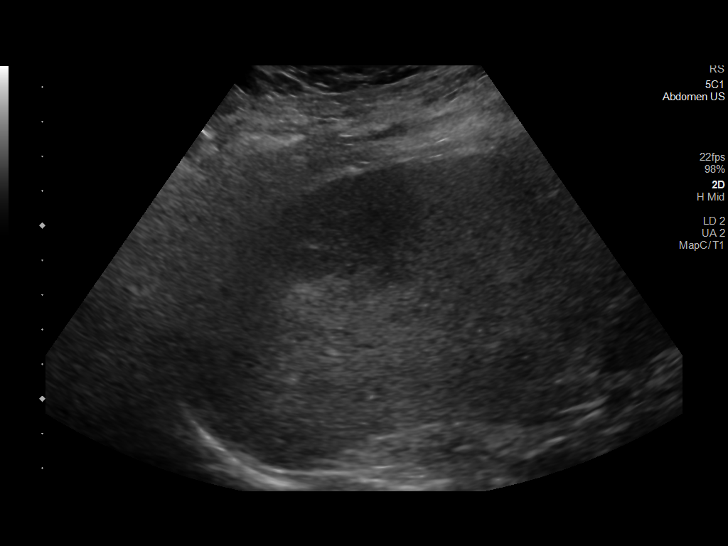
[im 2/16]
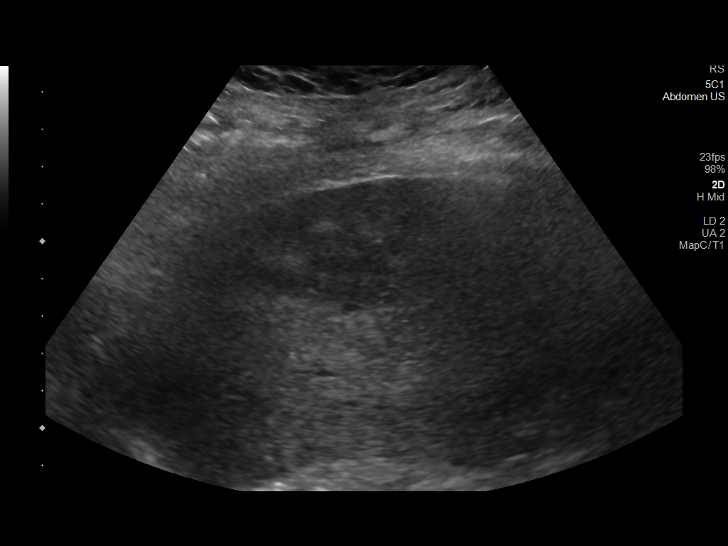
[im 4/16]
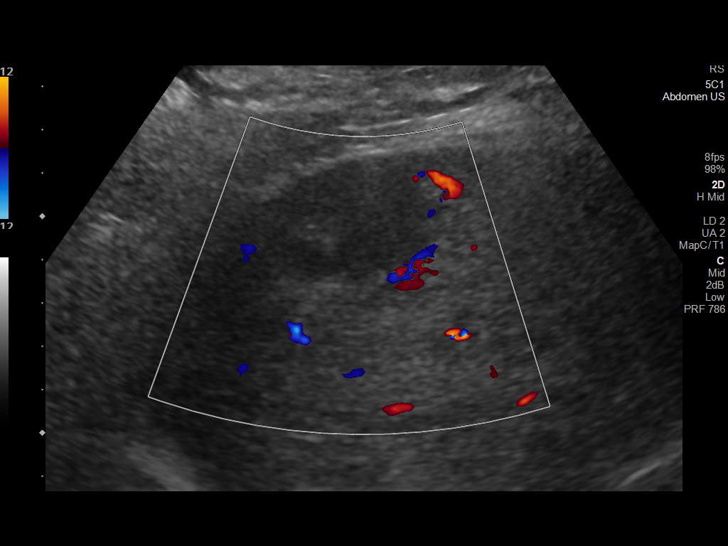
[im 5/16]
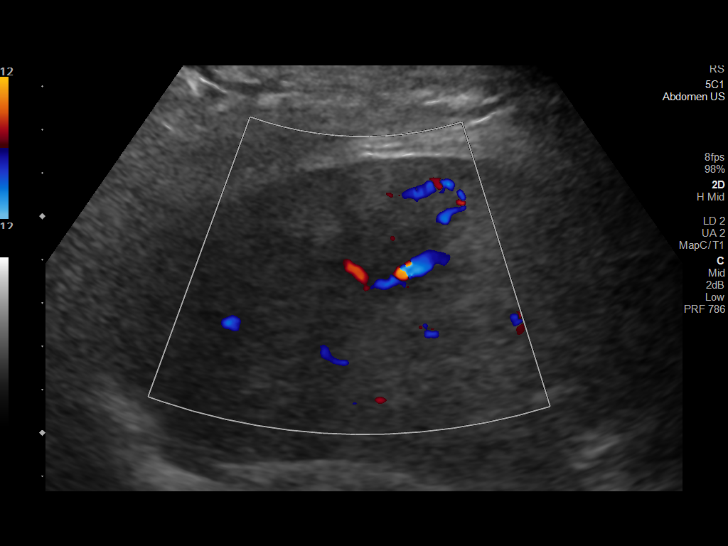
[im 6/16]
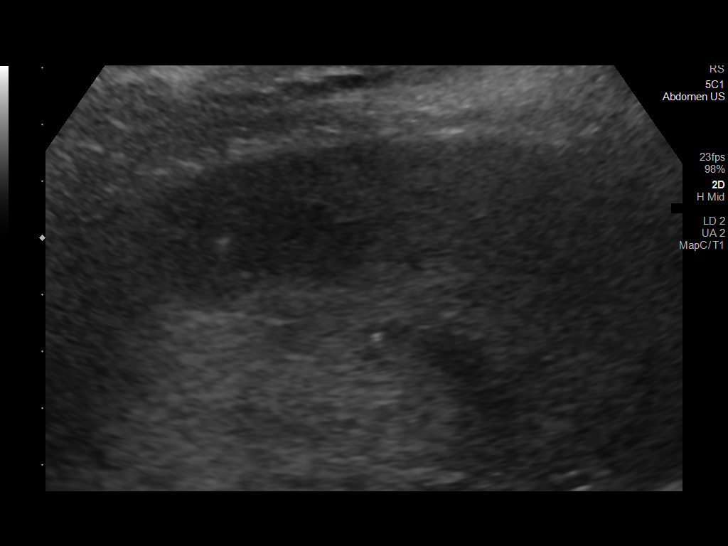
[im 7/16]
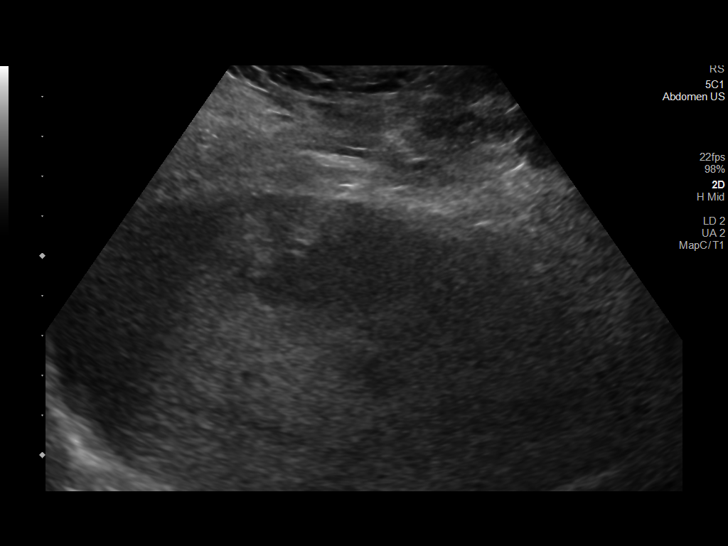
[im 9/16]
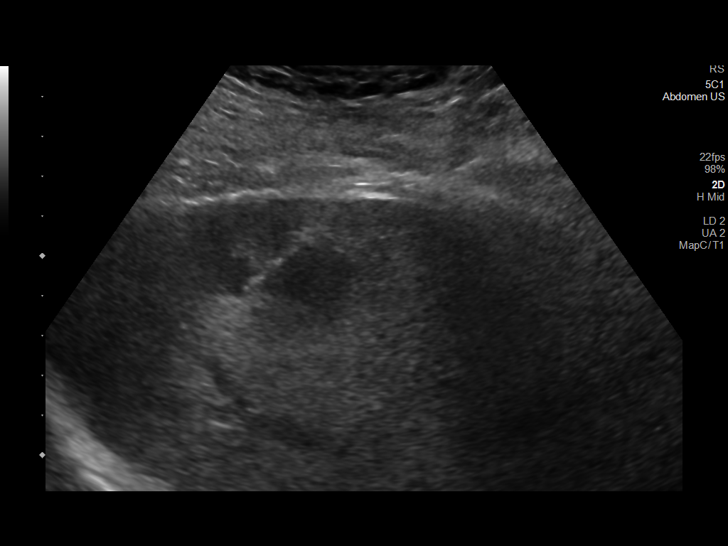
[im 10/16]
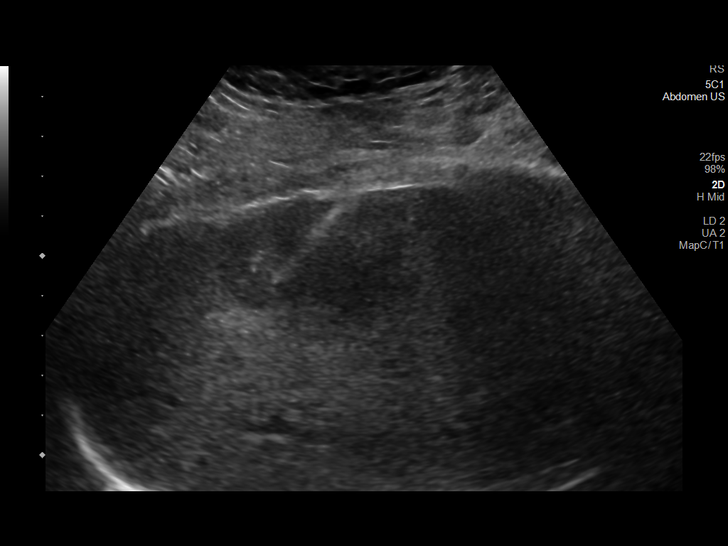
[im 11/16]
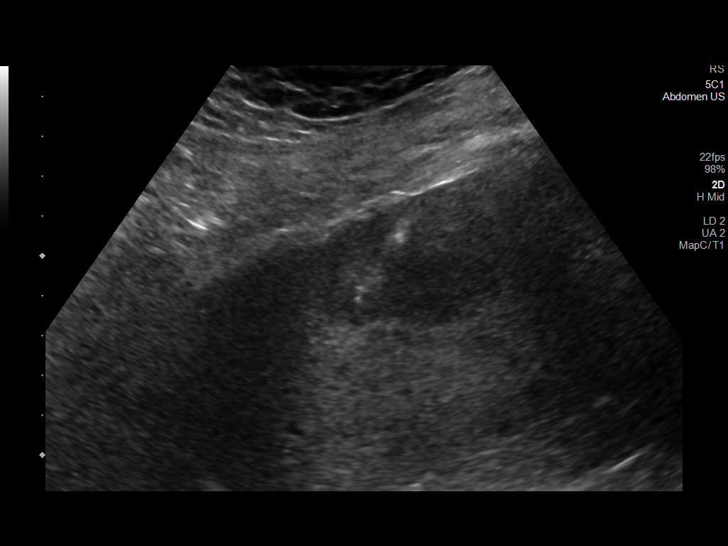
[im 12/16]
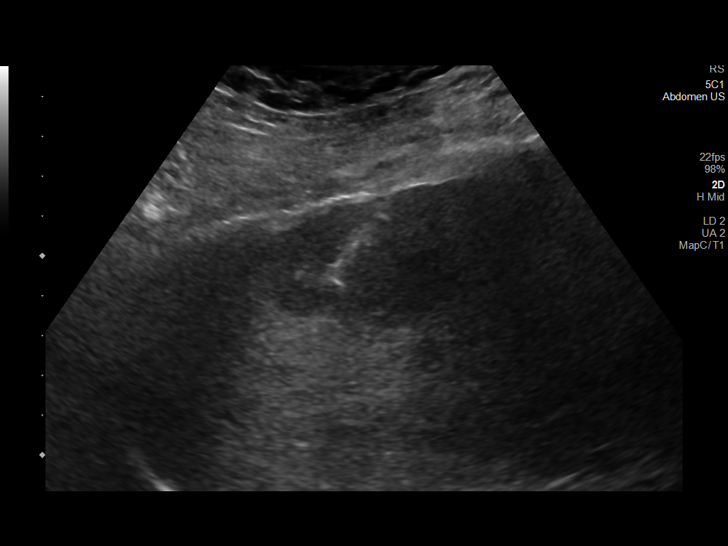
[im 13/16]
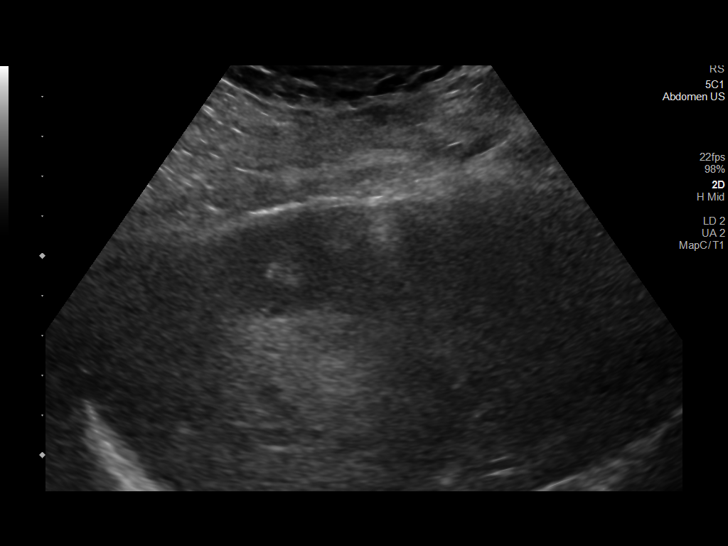
[im 15/16]
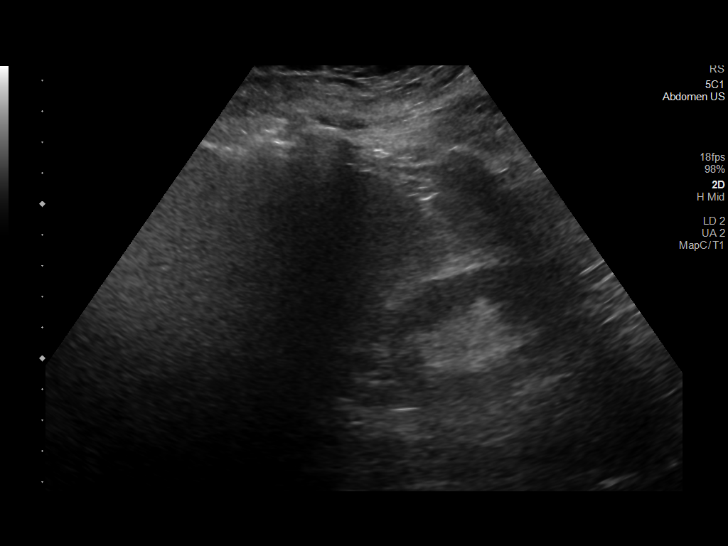
[im 16/16]
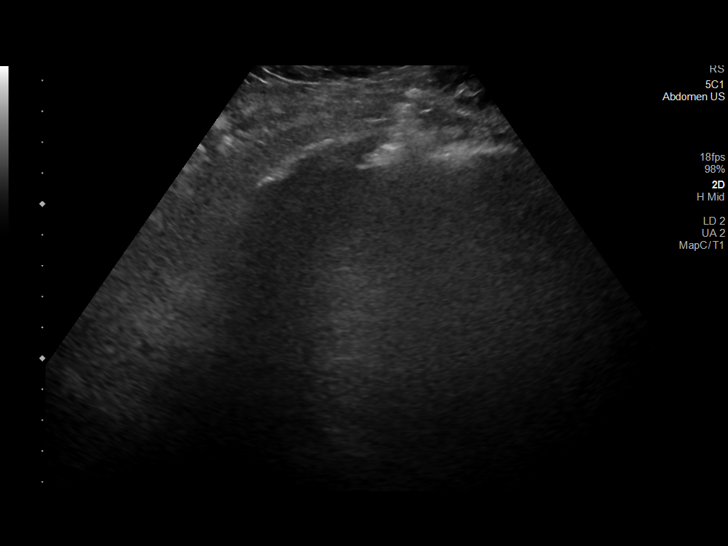

[13 of 16 positions shown; findings below may reference images not displayed]

MEDICATIONS:
None

ANESTHESIA/SEDATION:
Fentanyl 2 mcg IV; Versed 100 mg IV

Total Moderate Sedation time:  17 minutes.

The patient's level of consciousness and vital signs were monitored
continuously by radiology nursing throughout the procedure under my
direct supervision.

COMPLICATIONS:
None immediate.

PROCEDURE:
Informed written consent was obtained from the patient after a
discussion of the risks, benefits and alternatives to treatment. The
patient understands and consents the procedure. A timeout was
performed prior to the initiation of the procedure.

Ultrasound scanning was performed of the right upper abdominal
quadrant demonstrates ill-defined hypoechoic mass in the subcapsular
right lobe.

The right lobe mass was selected for biopsy and the procedure was
planned. The right upper abdominal quadrant was prepped and draped
in the usual sterile fashion. The overlying soft tissues were
anesthetized with 1% lidocaine with epinephrine. A 17 gauge, 6.8 cm
co-axial needle was advanced into a peripheral aspect of the lesion.
This was followed by 4 core biopsies with an 18 gauge core device
under direct ultrasound guidance.

The coaxial needle tract was embolized with a small amount of
Gel-Foam slurry and superficial hemostasis was obtained with manual
compression. Post procedural scanning was negative for definitive
area of hemorrhage or additional complication. A dressing was
placed. The patient tolerated the procedure well without immediate
post procedural complication.
IMPRESSION: Technically successful ultrasound guided core needle biopsy of right
hepatic mass.

## 2020-04-10 MED ORDER — FENTANYL CITRATE (PF) 100 MCG/2ML IJ SOLN
INTRAMUSCULAR | Status: AC | PRN
Start: 2020-04-10 — End: 2020-04-10
  Administered 2020-04-10: 25 ug via INTRAVENOUS
  Administered 2020-04-10: 50 ug via INTRAVENOUS
  Administered 2020-04-10: 25 ug via INTRAVENOUS

## 2020-04-10 MED ORDER — MIDAZOLAM HCL 2 MG/2ML IJ SOLN
INTRAMUSCULAR | Status: AC
  Filled 2020-04-10: qty 4

## 2020-04-10 MED ORDER — DEXTROSE 50 % IV SOLN
25.0000 mL | Freq: Once | INTRAVENOUS | Status: AC
  Administered 2020-04-10: 15:00:00 25 mL via INTRAVENOUS

## 2020-04-10 MED ORDER — LIDOCAINE HCL (PF) 1 % IJ SOLN
INTRAMUSCULAR | Status: AC
  Filled 2020-04-10: qty 30

## 2020-04-10 MED ORDER — GELATIN ABSORBABLE 12-7 MM EX MISC
CUTANEOUS | Status: AC
  Filled 2020-04-10: qty 1

## 2020-04-10 MED ORDER — FENTANYL CITRATE (PF) 100 MCG/2ML IJ SOLN
INTRAMUSCULAR | Status: AC
  Filled 2020-04-10: qty 4

## 2020-04-10 MED ORDER — HYDROCODONE-ACETAMINOPHEN 5-325 MG PO TABS
ORAL_TABLET | ORAL | Status: AC
  Administered 2020-04-10: 16:00:00 1 via ORAL
  Filled 2020-04-10: qty 1

## 2020-04-10 MED ORDER — MIDAZOLAM HCL 2 MG/2ML IJ SOLN
INTRAMUSCULAR | Status: AC | PRN
  Administered 2020-04-10: 1 mg via INTRAVENOUS
  Administered 2020-04-10 (×2): 0.5 mg via INTRAVENOUS

## 2020-04-10 MED ORDER — DEXTROSE 50 % IV SOLN: 25.0000 g | INTRAVENOUS | Status: DC

## 2020-04-10 MED ORDER — DEXTROSE 50 % IV SOLN
INTRAVENOUS | Status: AC
  Filled 2020-04-10: qty 50

## 2020-04-10 MED ORDER — SODIUM CHLORIDE 0.9 % IV SOLN: INTRAVENOUS | Status: DC

## 2020-04-10 MED ORDER — HYDROCODONE-ACETAMINOPHEN 5-325 MG PO TABS: 1.0000 | ORAL_TABLET | Freq: Once | ORAL | Status: AC

## 2020-04-10 NOTE — Discharge Instructions (Addendum)
Liver Biopsy, Care After These instructions give you information about how to care for yourself after your procedure. Your health care provider may also give you more specific instructions. If you have problems or questions, contact your health care provider. What can I expect after the procedure? After your procedure, it is common to have:  Pain and soreness in the area where the biopsy was done.  Bruising around the area where the biopsy was done.  Sleepiness and fatigue for 1-2 days. Follow these instructions at home: Medicines  Take over-the-counter and prescription medicines only as told by your health care provider.  If you were prescribed an antibiotic medicine, take it as told by your health care provider. Do not stop taking the antibiotic even if you start to feel better.  Do not take medicines such as aspirin and ibuprofen unless your health care provider tells you to take them. These medicines thin your blood and can increase the risk of bleeding.  If you are taking prescription pain medicine, take actions to prevent or treat constipation. Your health care provider may recommend that you: ? Drink enough fluid to keep your urine pale yellow. ? Eat foods that are high in fiber, such as fresh fruits and vegetables, whole grains, and beans. ? Limit foods that are high in fat and processed sugars, such as fried or sweet foods. ? Take an over-the-counter or prescription medicine for constipation. Incision care  Follow instructions from your health care provider about how to take care of your incision. Make sure you: ? Wash your hands with soap and water before you change your bandage (dressing). If soap and water are not available, use hand sanitizer. ? Change your dressing as told by your health care provider. ? Leave stitches (sutures), skin glue, or adhesive strips in place. These skin closures may need to stay in place for 2 weeks or longer. If adhesive strip edges start to  loosen and curl up, you may trim the loose edges. Do not remove adhesive strips completely unless your health care provider tells you to do that.  Check your incision area every day for signs of infection. Check for: ? Redness, swelling, or pain. ? Fluid or blood. ? Warmth. ? Pus or a bad smell.  Do not take baths, swim, or use a hot tub until your health care provider says it is okay to do so. Activity   Rest at home for 1-2 days, or as directed by your health care provider. ? Avoid sitting for a long time without moving. Get up to take short walks every 1-2 hours. This is important to improve blood flow and breathing. Ask for help if you feel weak or unsteady.  Return to your normal activities as told by your health care provider. Ask your health care provider what activities are safe for you.  Do not drive or use heavy machinery while taking prescription pain medicine.  Do not lift anything that is heavier than 10 lb (4.5 kg), or the limit that your health care provider tells you, until he or she says that it is safe.  Do not play contact sports for 2 weeks after the procedure. General instructions   Do not drink alcohol in the first week after the procedure.  Have someone stay with you for at least 24 hours after the procedure.  It is your responsibility to obtain your test results. Ask your health care provider, or the department that is doing the test: ? When will my   results be ready? ? How will I get my results? ? What are my treatment options? ? What other tests do I need? ? What are my next steps?  Keep all follow-up visits as told by your health care provider. This is important. Contact a health care provider if:  You have increased bleeding from an incision, resulting in more than a small spot of blood.  You have redness, swelling, or increasing pain in any incisions.  You notice a discharge or a bad smell coming from any of your incisions.  You have a fever or  chills. Get help right away if:  You develop swelling, bloating, or pain in your abdomen.  You become dizzy or faint.  You develop a rash.  You have nausea or you vomit.  You faint, or you have shortness of breath or difficulty breathing.  You develop chest pain.  You have problems with your speech or vision.  You have trouble with your balance or moving your arms or legs. Summary  After the liver biopsy, it is common to have pain, soreness, and bruising in the area, as well as sleepiness and fatigue.  Take over-the-counter and prescription medicines only as told by your health care provider.  Follow instructions from your health care provider about how to care for your incision. Check the incision area daily for signs of infection. This information is not intended to replace advice given to you by your health care provider. Make sure you discuss any questions you have with your health care provider. Document Revised: 05/31/2018 Document Reviewed: 04/17/2017 Elsevier Patient Education  2020 Elsevier Inc. Moderate Conscious Sedation, Adult Sedation is the use of medicines to promote relaxation and relieve discomfort and anxiety. Moderate conscious sedation is a type of sedation. Under moderate conscious sedation, you are less alert than normal, but you are still able to respond to instructions, touch, or both. Moderate conscious sedation is used during short medical and dental procedures. It is milder than deep sedation, which is a type of sedation under which you cannot be easily woken up. It is also milder than general anesthesia, which is the use of medicines to make you unconscious. Moderate conscious sedation allows you to return to your regular activities sooner. Tell a health care provider about:  Any allergies you have.  All medicines you are taking, including vitamins, herbs, eye drops, creams, and over-the-counter medicines.  Use of steroids (by mouth or creams).  Any  problems you or family members have had with sedatives and anesthetic medicines.  Any blood disorders you have.  Any surgeries you have had.  Any medical conditions you have, such as sleep apnea.  Whether you are pregnant or may be pregnant.  Any use of cigarettes, alcohol, marijuana, or street drugs. What are the risks? Generally, this is a safe procedure. However, problems may occur, including:  Getting too much medicine (oversedation).  Nausea.  Allergic reaction to medicines.  Trouble breathing. If this happens, a breathing tube may be used to help with breathing. It will be removed when you are awake and breathing on your own.  Heart trouble.  Lung trouble. What happens before the procedure? Staying hydrated Follow instructions from your health care provider about hydration, which may include:  Up to 2 hours before the procedure - you may continue to drink clear liquids, such as water, clear fruit juice, black coffee, and plain tea. Eating and drinking restrictions Follow instructions from your health care provider about eating and drinking, which   may include:  8 hours before the procedure - stop eating heavy meals or foods such as meat, fried foods, or fatty foods.  6 hours before the procedure - stop eating light meals or foods, such as toast or cereal.  6 hours before the procedure - stop drinking milk or drinks that contain milk.  2 hours before the procedure - stop drinking clear liquids. Medicine Ask your health care provider about:  Changing or stopping your regular medicines. This is especially important if you are taking diabetes medicines or blood thinners.  Taking medicines such as aspirin and ibuprofen. These medicines can thin your blood. Do not take these medicines before your procedure if your health care provider instructs you not to.  Tests and exams  You will have a physical exam.  You may have blood tests done to show: ? How well your  kidneys and liver are working. ? How well your blood can clot. General instructions  Plan to have someone take you home from the hospital or clinic.  If you will be going home right after the procedure, plan to have someone with you for 24 hours. What happens during the procedure?  An IV tube will be inserted into one of your veins.  Medicine to help you relax (sedative) will be given through the IV tube.  The medical or dental procedure will be performed. What happens after the procedure?  Your blood pressure, heart rate, breathing rate, and blood oxygen level will be monitored often until the medicines you were given have worn off.  Do not drive for 24 hours. This information is not intended to replace advice given to you by your health care provider. Make sure you discuss any questions you have with your health care provider. Document Revised: 03/20/2017 Document Reviewed: 07/28/2015 Elsevier Patient Education  2020 Elsevier Inc.  

## 2020-04-10 NOTE — H&P (Signed)
Chief Complaint: Patient was seen in consultation today for Liver lesion biopsy at the request of Harper,Kristen S  Referring Physician(s): Erenest Rasher  Supervising Physician: Dr Ruthann Cancer  Patient Status: [place]  History of Present Illness: Veronica Wiley is a 74 y.o. female   Hx Colon Ca Hx Breast Ca Low abd pain x 6 mo Bloating and pain Was seen in PCP office Imaging revealing liver lesion  MRI 03/21/20: IMPRESSION: 1. Although possibly benign entity such as hyalinized hemangioma, the 5.3 by 3.8 by 5.7 cm has complex enhancement characteristics and could represent hepatocellular carcinoma or cholangiocarcinoma. Biopsy/tissue diagnosis is recommended. 2. Complex cystic lesion along the right side of the vaginal cuff measuring 3.9 by 2.0 by 2.6 cm. This has heterogeneous T2 signal but only mild marginal enhancement. This is considered a cyst with indeterminate characteristics and based on current guidelines, surgical evaluation should be considered. If surgical evaluation is not elected, sonographic surveillance would be recommended. 3. Cholelithiasis. 4. Lumbar spondylosis and degenerative disc disease. 5.  Aortic Atherosclerosis (ICD10-I70.0). 6. Mild cardiomegaly.  Now scheduled for liver lesion biopsy   Past Medical History:  Diagnosis Date  . Adjustment disorder with depressed mood   . Dysphagia   . GERD (gastroesophageal reflux disease)   . History of breast cancer 2020  . Hyperlipidemia   . Malignant tumor of colon ([place])    About 15 years ago per patient (documented 07/2019)  . OSA (obstructive sleep apnea)    on CPAP  . 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 Type 2 diabetes mellitus with complications ([place])   . Vitamin D deficiency     Past Surgical History:  Procedure Laterality Date  . BIOPSY  07/01/2019   Procedure: BIOPSY;  Surgeon: Daneil Dolin, MD;  Location: [facility];  Service: Endoscopy;;   esophageal, gastric  . BREAST LUMPECTOMY Right 2020   x3  . COLON RESECTION     about 15 years ago; left-sided (per patient) (documented 07/2019)  . COLONOSCOPY  07/28/2016   Dr. Earley Brooke; hemorrhoids, diverticulosis (few in the sigmoid colon), colon polyps (two-3 mm polyps biopsied from the rectum).  Pathology revealed hyperplastic polyp.  Next colonoscopy due in 2023.  Marland Kitchen ESOPHAGOGASTRODUODENOSCOPY  05/21/2016   Dr. Earley Brooke; hiatal hernia, polyps (2-3 mm inflammatory looking polyps in the antrum, biopsies taken).  Esophageal balloon dilation performed.  . ESOPHAGOGASTRODUODENOSCOPY N/A 07/01/2019   Procedure: ESOPHAGOGASTRODUODENOSCOPY (EGD);  Surgeon: Daneil Dolin, MD;   mucosal changes in the esophagus (benign biopsy) but otherwise normal-appearing s/p empiric dilation, nodular proximal gastric mucosa (biopsy with mild chronic gastritis, no H. Pylori).    Marland Kitchen MALONEY DILATION N/A 07/01/2019   Procedure: Venia Minks DILATION;  Surgeon: Daneil Dolin, MD;  Location: [facility];  Service: Endoscopy;  Laterality: N/A;    Allergies: Enoxaparin, Ivp dye [iodinated diagnostic agents], and Penicillins  Medications: Prior to Admission medications   Medication Sig Start Date End Date Taking? Authorizing Provider  aspirin EC 81 MG tablet Take 81 mg by mouth daily.   Yes [provider]  atorvastatin (LIPITOR) 80 MG tablet Take 80 mg by mouth daily.   Yes [provider]  Cholecalciferol (VITAMIN D3) 50 MCG (2000 UT) capsule Take 2,000 Units by mouth daily. 03/21/19  Yes [provider]  clopidogrel (PLAVIX) 75 MG tablet Take 75 mg by mouth daily.   Yes [provider]  DEXILANT 60 MG capsule TAKE 1 CAPSULE BY MOUTH  EVERY DAY Patient taking differently: Take 60 mg by mouth daily. 12/21/19  Yes Annitta Needs, NP  dorzolamide-timolol (COSOPT) 22.3-6.8 MG/ML ophthalmic solution Place 1 drop into both eyes daily. 06/23/19  Yes [provider]  Insulin Infusion Pump  DEVI by Does not apply route. Basal rate 225 ( not sure) Novolog   Yes [provider]  letrozole (FEMARA) 2.5 MG tablet Take 2.5 mg by mouth daily.   Yes [provider]  losartan (COZAAR) 25 MG tablet Take 25 mg by mouth daily as needed (If blood pressure is over 140).    Yes [provider]  sertraline (ZOLOFT) 100 MG tablet Take 150 mg by mouth daily.    Yes [provider]  diazepam (VALIUM) 2 MG tablet Take 1 tablet (2mg  total) by mouth x1 45 minutes before MRI. Patient not taking: Reported on 04/06/2020 03/20/20   Erenest Rasher, PA-C     Family History  Problem Relation Age of Onset  . Colon cancer Neg Hx     Social History   Socioeconomic History  . Marital status: Single    Spouse name: Not on file  . Number of children: Not on file  . Years of education: Not on file  . Highest education level: Not on file  Occupational History  . Not on file  Tobacco Use  . Smoking status: Former Research scientist (life sciences)  . Smokeless tobacco: Never Used  . Tobacco comment: Never did smoke much just one or two daily  Substance and Sexual Activity  . Alcohol use: Not Currently  . Drug use: Never  . Sexual activity: Not on file  Other Topics Concern  . Not on file  Social History Narrative  . Not on file   Social Determinants of Health   Financial Resource Strain: Not on file  Food Insecurity: Not on file  Transportation Needs: Not on file  Physical Activity: Not on file  Stress: Not on file  Social Connections: Not on file     Review of Systems: A 12 point ROS discussed and pertinent positives are indicated in the HPI above.  All other systems are negative.  Review of Systems  Constitutional: Positive for activity change. Negative for fatigue and fever.  Respiratory: Negative for cough and shortness of breath.   Cardiovascular: Negative for chest pain.  Gastrointestinal: Positive for abdominal distention, abdominal pain and nausea.   Psychiatric/Behavioral: Negative for behavioral problems.    Vital Signs: BP (!) 141/58   Pulse 60   Temp 98.8 F (37.1 C) (Oral)   Ht 5\' 2"  (1.575 m)   Wt 181 lb (82.1 kg)   SpO2 97%   BMI 33.11 kg/m   Physical Exam Vitals reviewed.  HENT:     Mouth/Throat:     Mouth: Mucous membranes are moist.  Cardiovascular:     Rate and Rhythm: Normal rate and regular rhythm.     Heart sounds: Normal heart sounds.  Pulmonary:     Effort: Pulmonary effort is normal.     Breath sounds: Normal breath sounds.  Abdominal:     General: There is distension.     Palpations: Abdomen is soft.     Tenderness: There is abdominal tenderness.  Musculoskeletal:        General: Normal range of motion.  Skin:    General: Skin is warm.  Neurological:     Mental Status: She is alert and oriented to person, place, and time.  Psychiatric:  Behavior: Behavior normal.     Imaging: MR PELVIS W WO CONTRAST  Result Date: 03/22/2020 CLINICAL DATA:  Indeterminate right hepatic lobe lesion. Indeterminate right adnexal lesion. EXAM: MRI ABDOMEN AND PELVIS WITHOUT AND WITH CONTRAST TECHNIQUE: Multiplanar multisequence MR imaging of the abdomen and pelvis was performed both before and after the administration of intravenous contrast. CONTRAST:  21mL GADAVIST GADOBUTROL 1 MMOL/ML IV SOLN COMPARISON:  CT scan 03/03/2015 and pelvic ultrasound from 03/06/2020 FINDINGS: COMBINED FINDINGS FOR BOTH MR ABDOMEN AND PELVIS Lower chest: Mild bandlike scarring in the left lower lobe. Mild cardiomegaly. Suspected scarring in the right breast. Hepatobiliary: A heterogeneous lesion with primarily high T2 and low T1 signal characteristics in the right hepatic lobe measures about 5.3 by 3.8 by 5.7 cm. This has some ill definition of margins and the T2 signal hyperintensity is less intense in the posterior half of the lesion. This lesion demonstrates arterial phase enhancement which persists, along with a hypoenhancing  anterior portion measuring about 1.7 by 1.2 cm. There is also a small adjacent lesion measuring 0.8 by 0.4 cm in the subcapsular right hepatic lobe anterior to this dominant lesion, which demonstrates reducing conspicuity on delayed phase images. Appearance raises concern for malignancy such as cholangiocarcinoma or hepatocellular carcinoma, although hyalinized/atypical hemangioma can have a similar appearance. Tissue diagnosis recommended. Small filling defects in the gallbladder compatible with gallstones for example on image 17 of series 8. No biliary dilatation. Pancreas:  Unremarkable Spleen:  Unremarkable Adrenals/Urinary Tract:  Unremarkable Stomach/Bowel: Unremarkable Vascular/Lymphatic: Aortoiliac atherosclerotic vascular disease. Upper normal sized porta hepatis lymph nodes but not pathologically enlarged. Reproductive: Recent ultrasound showed a potentially complex cystic lesion along the right vaginal cuff, in the setting of prior hysterectomy. On today's MRI, we demonstrate a 3.9 by 2.0 by 2.6 cm complex structure resembling right ovary prior to contrast administration, but with internal hypoenhancement suggesting a complex cystic lesion. This abuts the right upper margin of the vaginal cuff and also abuts adjacent loops of nondilated small bowel limited enhancement along the margin of this lesion based on the subtraction images. Other:  No supplemental non-categorized findings. Musculoskeletal: Lumbar spondylosis and degenerative disc disease. IMPRESSION: 1. Although possibly benign entity such as hyalinized hemangioma, the 5.3 by 3.8 by 5.7 cm has complex enhancement characteristics and could represent hepatocellular carcinoma or cholangiocarcinoma. Biopsy/tissue diagnosis is recommended. 2. Complex cystic lesion along the right side of the vaginal cuff measuring 3.9 by 2.0 by 2.6 cm. This has heterogeneous T2 signal but only mild marginal enhancement. This is considered a cyst with indeterminate  characteristics and based on current guidelines, surgical evaluation should be considered. If surgical evaluation is not elected, sonographic surveillance would be recommended. 3. Cholelithiasis. 4. Lumbar spondylosis and degenerative disc disease. 5.  Aortic Atherosclerosis (ICD10-I70.0). 6. Mild cardiomegaly. Electronically Signed   By: Van Clines M.D.   On: 03/22/2020 09:17   MR Abdomen W Wo Contrast  Result Date: 03/22/2020 CLINICAL DATA:  Indeterminate right hepatic lobe lesion. Indeterminate right adnexal lesion. EXAM: MRI ABDOMEN AND PELVIS WITHOUT AND WITH CONTRAST TECHNIQUE: Multiplanar multisequence MR imaging of the abdomen and pelvis was performed both before and after the administration of intravenous contrast. CONTRAST:  41mL GADAVIST GADOBUTROL 1 MMOL/ML IV SOLN COMPARISON:  CT scan 03/03/2015 and pelvic ultrasound from 03/06/2020 FINDINGS: COMBINED FINDINGS FOR BOTH MR ABDOMEN AND PELVIS Lower chest: Mild bandlike scarring in the left lower lobe. Mild cardiomegaly. Suspected scarring in the right breast. Hepatobiliary: A heterogeneous lesion with primarily high T2 and  low T1 signal characteristics in the right hepatic lobe measures about 5.3 by 3.8 by 5.7 cm. This has some ill definition of margins and the T2 signal hyperintensity is less intense in the posterior half of the lesion. This lesion demonstrates arterial phase enhancement which persists, along with a hypoenhancing anterior portion measuring about 1.7 by 1.2 cm. There is also a small adjacent lesion measuring 0.8 by 0.4 cm in the subcapsular right hepatic lobe anterior to this dominant lesion, which demonstrates reducing conspicuity on delayed phase images. Appearance raises concern for malignancy such as cholangiocarcinoma or hepatocellular carcinoma, although hyalinized/atypical hemangioma can have a similar appearance. Tissue diagnosis recommended. Small filling defects in the gallbladder compatible with gallstones for  example on image 17 of series 8. No biliary dilatation. Pancreas:  Unremarkable Spleen:  Unremarkable Adrenals/Urinary Tract:  Unremarkable Stomach/Bowel: Unremarkable Vascular/Lymphatic: Aortoiliac atherosclerotic vascular disease. Upper normal sized porta hepatis lymph nodes but not pathologically enlarged. Reproductive: Recent ultrasound showed a potentially complex cystic lesion along the right vaginal cuff, in the setting of prior hysterectomy. On today's MRI, we demonstrate a 3.9 by 2.0 by 2.6 cm complex structure resembling right ovary prior to contrast administration, but with internal hypoenhancement suggesting a complex cystic lesion. This abuts the right upper margin of the vaginal cuff and also abuts adjacent loops of nondilated small bowel limited enhancement along the margin of this lesion based on the subtraction images. Other:  No supplemental non-categorized findings. Musculoskeletal: Lumbar spondylosis and degenerative disc disease. IMPRESSION: 1. Although possibly benign entity such as hyalinized hemangioma, the 5.3 by 3.8 by 5.7 cm has complex enhancement characteristics and could represent hepatocellular carcinoma or cholangiocarcinoma. Biopsy/tissue diagnosis is recommended. 2. Complex cystic lesion along the right side of the vaginal cuff measuring 3.9 by 2.0 by 2.6 cm. This has heterogeneous T2 signal but only mild marginal enhancement. This is considered a cyst with indeterminate characteristics and based on current guidelines, surgical evaluation should be considered. If surgical evaluation is not elected, sonographic surveillance would be recommended. 3. Cholelithiasis. 4. Lumbar spondylosis and degenerative disc disease. 5.  Aortic Atherosclerosis (ICD10-I70.0). 6. Mild cardiomegaly. Electronically Signed   By: Van Clines M.D.   On: 03/22/2020 09:17    Labs:  CBC: Recent Labs    07/01/19 1230 08/30/19 1311  WBC 9.1 10.7  HGB 11.3* 12.3  HCT 37.2 39.3  PLT 278 351     COAGS: No results for input(s): INR, APTT in the last 8760 hours.  BMP: Recent Labs    07/01/19 1230 08/30/19 1311  NA 141 143  K 3.7 4.0  CL 108 105  CO2 27 27  GLUCOSE 87 52*  BUN 10 11  CALCIUM 8.8* 9.9  CREATININE 0.52 0.70  GFRNONAA >60 86  GFRAA >60 99    LIVER FUNCTION TESTS: Recent Labs    07/01/19 1230 08/30/19 1311  BILITOT 0.4 0.4  AST 27 23  ALT 20 15  ALKPHOS 100 151*  PROT 6.5 6.9  ALBUMIN 3.2* 4.0    TUMOR MARKERS: No results for input(s): AFPTM, CEA, CA199, CHROMGRNA in the last 8760 hours.  Assessment and Plan:  Hx colon and breast ca abd pain and bloating x 6 mo New liver lesion on imaging Now scheduled for biopsy of same Risks and benefits of liver lesion biopsy was discussed with the patient and/or patient's family including, but not limited to bleeding, infection, damage to adjacent structures or low yield requiring additional tests.  All of the questions were answered and  there is agreement to proceed. Consent signed and in chart.   Thank you for this interesting consult.  I greatly enjoyed meeting Veronica Wiley and look forward to participating in their care.  A copy of this report was sent to the requesting provider on this date.  Electronically Signed: Lavonia Drafts, PA-C 04/10/2020, 11:35 AM   I spent a total of  30 Minutes   in face to face in clinical consultation, greater than 50% of which was counseling/coordinating care for liver lesion bx

## 2020-04-10 NOTE — Progress Notes (Signed)
Report received from [facility] and states client c/o "feeling like sugar is low" I asked Greg,RN if he checked client's blood sugar and he did not; when client arrived to me CBG was 34 and client was only arousable with verbal and tactile stimuli; 50% dextrose given; client when aroused removed insulin pump; drank juice and soda and ate peanut butter crackers; CBG 97 after 50% dextrose

## 2020-04-10 NOTE — Procedures (Signed)
Interventional Radiology Procedure Note  Procedure: Ultrasound guided right lobe liver mass biopsy  Findings: Please refer to procedural dictation for full description. 18 ga core x 4 of right hepatic mass, samples placed in formalin and sent to Pathology.  Gelfoam slurry needle track embolization.  Complications: None immediate  Estimated Blood Loss: < 5 mL  Recommendations: Bedrest 3 hours. Follow up Pathology results.   Ruthann Cancer, MD

## 2020-04-11 LAB — SURGICAL PATHOLOGY

## 2020-04-11 NOTE — Telephone Encounter (Signed)
Received a call from the pathologist Dr. 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  I have discuss these results with patient.  She is currently following with an oncologist Berton Mount, MD at [facility]. We will send results to her oncologist and reach out to them to request an office visit for patient ASAP to discuss treatment options.   Manuela Schwartz: Please fax liver biopsy results to Berton Mount, MD.   Elmo Putt: As I am not in the office tomorrow, can you please reach out to Berton Mount, MD at [facility] and let him know patient's biopsy came back positive for hepatocellular carcinoma and see if they can arrange an office visit ASAP?  Regarding cirrhosis: She has no signs or symptoms of decompensated liver disease.  We will update labs including CMP, AFP, hepatitis A, B, and C serologies.  She will have labs completed through [facility].

## 2020-04-12 NOTE — Telephone Encounter (Signed)
Liver biopsy cc'ed to Berton Mount, MD

## 2020-04-12 NOTE — Telephone Encounter (Signed)
Called Dr. Jewel Baize office and lmom. Waiting on a return call. Will keep trying to get some to discuss appointment needed and results.

## 2020-04-12 NOTE — Telephone Encounter (Signed)
Spoke with Veronica Wiley at Dr. Jewel Baize office. Pt's apt was moved up to 04/27/19 @ 10 :00 AM. They are aware that records were sent to their office with results of biopsy.

## 2020-04-12 NOTE — Telephone Encounter (Signed)
Pt is going to have labs completed at [facility].

## 2020-04-23 ENCOUNTER — Encounter: Payer: Medicare Other | Admitting: Obstetrics & Gynecology

## 2020-04-29 NOTE — Telephone Encounter (Signed)
Elmo Putt, can we reach out to patient to remind her about having labs completed?

## 2020-04-30 ENCOUNTER — Other Ambulatory Visit: Payer: Self-pay

## 2020-04-30 DIAGNOSIS — Z79899 Other long term (current) drug therapy: Secondary | ICD-10-CM

## 2020-04-30 DIAGNOSIS — K7460 Unspecified cirrhosis of liver: Secondary | ICD-10-CM

## 2020-04-30 NOTE — Telephone Encounter (Signed)
Spoke with pt. Pt asked if the lab orders could be changed to [facility] and not [facility]. Lab orders were changed and mailed to pt. Pt is aware that she can go to the lab without having the papers in her hands to complete labs.

## 2020-05-14 ENCOUNTER — Other Ambulatory Visit: Payer: Medicare Other

## 2020-05-15 ENCOUNTER — Encounter: Payer: Medicare Other | Admitting: Obstetrics & Gynecology

## 2020-05-15 ENCOUNTER — Other Ambulatory Visit: Payer: Medicare Other

## 2020-05-16 ENCOUNTER — Other Ambulatory Visit: Payer: Self-pay | Admitting: Obstetrics & Gynecology

## 2020-05-16 DIAGNOSIS — N83201 Unspecified ovarian cyst, right side: Secondary | ICD-10-CM

## 2020-05-17 ENCOUNTER — Other Ambulatory Visit: Payer: Medicare Other

## 2020-05-17 ENCOUNTER — Encounter: Payer: Medicare Other | Admitting: Obstetrics & Gynecology

## 2020-05-31 ENCOUNTER — Ambulatory Visit (INDEPENDENT_AMBULATORY_CARE_PROVIDER_SITE_OTHER): Payer: Medicare Other | Admitting: Obstetrics & Gynecology

## 2020-05-31 ENCOUNTER — Other Ambulatory Visit: Payer: Self-pay

## 2020-05-31 ENCOUNTER — Encounter: Payer: Self-pay | Admitting: Obstetrics & Gynecology

## 2020-05-31 ENCOUNTER — Other Ambulatory Visit: Payer: Self-pay | Admitting: Obstetrics & Gynecology

## 2020-05-31 ENCOUNTER — Ambulatory Visit (INDEPENDENT_AMBULATORY_CARE_PROVIDER_SITE_OTHER): Payer: Medicare Other

## 2020-05-31 VITALS — BP 121/72 | HR 135 | Wt 180.0 lb

## 2020-05-31 DIAGNOSIS — C561 Malignant neoplasm of right ovary: Secondary | ICD-10-CM

## 2020-05-31 DIAGNOSIS — N83201 Unspecified ovarian cyst, right side: Secondary | ICD-10-CM

## 2020-05-31 DIAGNOSIS — R1900 Intra-abdominal and pelvic swelling, mass and lump, unspecified site: Secondary | ICD-10-CM | POA: Diagnosis not present

## 2020-05-31 NOTE — Progress Notes (Signed)
PELVIC US LE:XNTZGY vaginal cuff,hypoechoic homogeneous right adnexal mass with a small amount of color flow,simple right adnexal fluid surrounding right adnexal mass,left ovary not visualized,right adnexal pain during ultrasound  Chaperone Tish

## 2020-06-02 NOTE — Progress Notes (Signed)
Follow up appointment for results  Chief Complaint  Patient presents with  . Follow-up    Blood pressure 121/72, pulse (!) 135, weight 180 lb (81.6 kg).                                                                                                                                      GYNECOLOGIC SONOGRAM   Veronica Wiley is a 75 y.o. No LMP recorded. Patient has had a hysterectomy. She is here for a pelvic sonogram for right adnexal mass seen on CT.  Uterus                      Surgically removed,normal vaginal cuff  Endometrium          N/A  Right ovary           3.3 X 2.8 X  2.3 cm  Hypoechoic, homogeneous right adnexal mass with a small amount of color flow,simple right adnexal fluid surrounding right adnexal mass  Left ovary               Not visualized   Right simple adnexal fluid   Technician Comments:  PELVIC US JO:ACZYSA vaginal cuff,   3.3 X 2.8 X  2.3 cm hypoechoic, homogeneous right adnexal mass with a small amount of color flow,simple right adnexal fluid surrounding right adnexal mass,left ovary not visualized,right adnexal pain during ultrasound  Chaperone [address] Heide Guile 05/31/2020 12:31 PM  Clinical Impression and recommendations:  I have reviewed the sonogram results above, combined with the patient's current clinical course, below are my impressions and any appropriate recommendations for management based on the sonographic findings.  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   Orders for this encounter: Orders Placed This Encounter  Procedures  . CA 125  . CA 125    Impression:   ICD-10-CM   1. 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       Face to face time:  20 minutes  Greater than 50% of the visit time was  spent in counseling and coordination of care with the patient.  The summary and outline of the counseling and care coordination is summarized in the note above.   All questions were answered.  Past Medical History:  Diagnosis Date  . Adjustment disorder with depressed mood   . Dysphagia   . GERD (gastroesophageal reflux disease)   . History of breast cancer 2020  . Hyperlipidemia   . Malignant tumor of colon ([place])    About 15 years ago per patient (documented 07/2019)  . OSA (obstructive sleep apnea)    on CPAP  . 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 Type 2 diabetes mellitus with complications ([place])   . Vitamin D deficiency     Past Surgical History:  Procedure Laterality Date  . BIOPSY  07/01/2019   Procedure: BIOPSY;  Surgeon: Daneil Dolin, MD;  Location: [facility];  Service: Endoscopy;;  esophageal, gastric  . BREAST LUMPECTOMY Right 2020   x3  . COLON RESECTION     about 15 years ago; left-sided (per patient) (documented 07/2019)  . COLONOSCOPY  07/28/2016   Dr. Earley Brooke; hemorrhoids, diverticulosis (few in the sigmoid colon), colon polyps (two-3 mm polyps biopsied from the rectum).  Pathology revealed hyperplastic polyp.  Next colonoscopy due in 2023.  Marland Kitchen ESOPHAGOGASTRODUODENOSCOPY  05/21/2016   Dr. Earley Brooke; hiatal hernia, polyps (2-3 mm inflammatory looking polyps in the antrum, biopsies taken).  Esophageal balloon dilation performed.   . ESOPHAGOGASTRODUODENOSCOPY N/A 07/01/2019   Procedure: ESOPHAGOGASTRODUODENOSCOPY (EGD);  Surgeon: Daneil Dolin, MD;   mucosal changes in the esophagus (benign biopsy) but otherwise normal-appearing s/p empiric dilation, nodular proximal gastric mucosa (biopsy with mild chronic gastritis, no H. Pylori).    Marland Kitchen MALONEY DILATION N/A 07/01/2019   Procedure: Venia Minks DILATION;  Surgeon: Daneil Dolin, MD;  Location: [facility];  Service: Endoscopy;  Laterality: N/A;    OB History   No obstetric history on file.     Allergies  Allergen Reactions  . Enoxaparin Other (See Comments)    Hallucinating  . Ivp Dye [Iodinated Diagnostic Agents] Other (See Comments)    Body got red and bumps  . Penicillins Rash    Did it involve swelling of the face/tongue/throat, SOB, or low BP? No Did it involve sudden or severe rash/hives, skin peeling, or any reaction on the inside of your mouth or nose? No Did you need to seek medical attention at a hospital or doctor's office? No When did it last happen?20  years If all above answers are "NO", may proceed with cephalosporin use.    Social History   Socioeconomic History  . Marital status: Single    Spouse name: Not on file  . Number of children: Not on file  . Years of education: Not on file  . Highest education level: Not on file  Occupational History  . Not on file  Tobacco Use  . Smoking status: Former Research scientist (life sciences)  . Smokeless tobacco: Never Used  . Tobacco comment: Never did smoke much just one or two daily  Substance and Sexual Activity  . Alcohol use: Not Currently  . Drug use: Never  . Sexual activity: Not on file  Other Topics Concern  . Not on file  Social History Narrative  . Not on file   Social Determinants of Health   Financial Resource Strain: Unknown  . Difficulty of Paying Living Expenses: Patient refused  Food Insecurity: No Food Insecurity  . Worried  About Running Out of Food in the Last Year: Never true  . Ran  Out of Food in the Last Year: Never true  Transportation Needs: No Transportation Needs  . Lack of Transportation (Medical): No  . Lack of Transportation (Non-Medical): No  Physical Activity: Insufficiently Active  . Days of Exercise per Week: 2 days  . Minutes of Exercise per Session: 10 min  Stress: No Stress Concern Present  . Feeling of Stress : Only a little  Social Connections: Moderately Integrated  . Frequency of Communication with Friends and Family: Patient refused  . Frequency of Social Gatherings with Friends and Family: More than three times a week  . Attends Religious Services: More than 4 times per year  . Active Member of Clubs or Organizations: Yes  . Attends Archivist Meetings: More than 4 times per year  . Marital Status: Never married    Family History  Problem Relation Age of Onset  . Colon cancer Neg Hx

## 2020-06-09 NOTE — Progress Notes (Signed)
  Referring Provider: Addis, Daniel, DO Primary Care Physician:  Addis, Daniel, DO Primary GI Physician: Dr. Rourk  Chief Complaint  Patient presents with  . Abdominal Pain    Daily; has liver cancer  . Gastroesophageal Reflux    some  . Nausea    Occ vomiting; no appetite    HPI:   Veronica Wiley is a 75 y.o. female presenting today for follow-up, last seen in our office in October 2021. History of GERD, dysphagia s/p dilation in March 2021 with no improvement, BPE April 2021 with normal exam, with history of stroke, questioned oropharyngeal weakness and offered SLP referral, but patient declined. History of postprandial bloating, early satiety, postprandial loose stools, colon cancer around 2006 with partial colectomy.   EGD March 2021 with mucosal changes in the esophagus (benign biopsy) but otherwise normal-appearing s/p empiric dilation, nodular proximal gastric mucosa (biopsy with mild chronic gastritis, no H. Pylori).  Colonoscopy in 2018 with Dr. Pandyawith hemorrhoids, diverticulosis, and two hyperplastic polyps.  Recommended repeat in 2023. TSH normal.  Celiac serologies negative.  GES normal.  RUQ ultrasound April 2021 with normal-appearing gallbladder and fatty liver.  Underwent CT A/P without contrast 03/02/2020 due to intermittent lower abdominal pain and TTP in LLQ with indeterminate liver lesion, indeterminate right adnexal lesion, and indeterminate nodular-like density in the left lower lung lobe.  MRI abdomen and pelvis with and without contrast again with indeterminate liver lesion with recommendations for tissue diagnosis, complex cystic lesion along vaginal cuff.  Due to indeterminate characteristics, stated surgical evaluation should be considered versus sonographic surveillance.  She was scheduled for liver biopsy and referred to OB/GYN.  Liver biopsy consistent with hepatocellular carcinoma and background changes consistent with cirrhosis.  Recommended following up  with her current oncologist, Akintunde Akinleye, MD at [facility], ASAP.  Also ordered labs for cirrhosis, AFP, and hepatitis serologies.  Underwent pelvic/transvaginal ultrasound 05/31/2020 with Dr. Eure with 3.3 X 2.8 X  2.3 cm hypoechoic, homogeneous right adnexal mass with a small amount of color flow,simple right adnexal fluid surrounding right adnexal mass,left ovary not visualized,right adnexal pain during ultrasound. Dr. 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  Plan to update CA 125.    CA-125 has not been completed and cirrhosis labs/hepatitis labs have not been completed.  Today: Patient presents today with her caregiver and legal guardian Veronica Wiley. Patient has declined quite a bit since I saw her last. She is now using a walker, has significant weakness, and appears chronically ill today. Had COVID in January. This is when the weakness and fatigue started.Only able to walk a few steps at a time.  Some shortness of breath when walking. Ongoing cough since COVID. She has seen PCP since COVID. No fever. She actually wasn't very sick when she had COVID. Fatigue was her main symptom. Did not require hospitalization. States PCP wants to get her in physical therapy but trying to get to [facility] first.   Hepatocellular carcinoma: Veronica states patient has been diagnosed with stage 3 liver cancer. States she has 1 large tumor and 2 small tumors. Supposed to undergo TACE at [facility]. She has been referred but hasn't heard anything. Referral was placed on 2/4. Veronica called [facility] last week to try to follow-up on this, but wasn't able to get anywhere. States she was on the phone for hours. She was told [facility] has the referral.   Patient has a lot of anxiety   surrounding diagnosis of hepatocellular carcinoma. Not eating well. Upper abdominal pain started in January. Pain is in the mid upper abdomen and right side. Sometimes it will  hurt all day. Other days, it "feels funny." Can be 10/10.  No specific trigger. Symptoms can be worsened with meals, but not always. Associated nausea. Decreased appetite. Worsening early satiety. Nausea can be associated with eating, but not always. Nausea 2-3 times a week. Vomiting 1-2 times a week. No hematemesis or coffee ground emesis. Had black stool in early February x1. No bright red blood in her stool. Not taking oral iron. No pepto bismol. GERD symptoms about twice a week. Currently on Dexilant. Has been on Protonix previously.  Continues with postprandial bloating when she does eat. No regular NSAID use.   Bowels are not moving as also used to.  BMs every 3 days.  She attributes this to decreased oral intake.  Stools may be loose or hard.  Cirrhosis: Some difficulty thinking at times and losing her train of thought. Veronica states she will just stare off at times. No scleral icterus. No swelling in abdomen or LE. No bruising.   Dysphagia: Very rare.    Discussed possibility of updating colonoscopy considering new diagnosis of hepatocellular carcinoma, but due to upper GI symptoms and weakness/fatigue, patient will not tolerate prep at this time.   Past Medical History:  Diagnosis Date  . Adjustment disorder with depressed mood   . Cirrhosis (HCC) 03/2020  . Dysphagia   . GERD (gastroesophageal reflux disease)   . Hepatocellular carcinoma (HCC) 03/2020  . History of breast cancer 2020  . HTN (hypertension)   . Hyperlipidemia   . Malignant tumor of colon (HCC)    Around 2006  . OSA (obstructive sleep apnea)    on CPAP  . 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 Type 2 diabetes mellitus with complications (HCC)   . Vitamin D deficiency     Past Surgical History:  Procedure Laterality Date  . BIOPSY  07/01/2019   Procedure: BIOPSY;  Surgeon: Rourk, Robert M, MD;  Location: [facility];  Service: Endoscopy;;  esophageal, gastric  . BREAST LUMPECTOMY  Right 2020   x3  . COLON RESECTION     about 15 years ago; left-sided (per patient) (documented 07/2019)  . COLONOSCOPY  07/28/2016   Dr. Pandya; hemorrhoids, diverticulosis (few in the sigmoid colon), colon polyps (two-3 mm polyps biopsied from the rectum).  Pathology revealed hyperplastic polyp.  Next colonoscopy due in 2023.  . ESOPHAGOGASTRODUODENOSCOPY  05/21/2016   Dr. Pandya; hiatal hernia, polyps (2-3 mm inflammatory looking polyps in the antrum, biopsies taken).  Esophageal balloon dilation performed.  . ESOPHAGOGASTRODUODENOSCOPY N/A 07/01/2019   Procedure: ESOPHAGOGASTRODUODENOSCOPY (EGD);  Surgeon: Rourk, Robert M, MD;   mucosal changes in the esophagus (benign biopsy) but otherwise normal-appearing s/p empiric dilation, nodular proximal gastric mucosa (biopsy with mild chronic gastritis, no H. Pylori).    . MALONEY DILATION N/A 07/01/2019   Procedure: MALONEY DILATION;  Surgeon: Rourk, Robert M, MD;  Location: [facility];  Service: Endoscopy;  Laterality: N/A;    Current Outpatient Medications  Medication Sig Dispense Refill  . aspirin EC 81 MG tablet Take 81 mg by mouth daily.    . atorvastatin (LIPITOR) 80 MG tablet Take 80 mg by mouth daily.    . Cholecalciferol (VITAMIN D3) 50 MCG (2000 UT) capsule Take 2,000 Units by mouth daily.    . clopidogrel (PLAVIX) 75 MG   tablet Take 75 mg by mouth daily.    . DEXILANT 60 MG capsule TAKE 1 CAPSULE BY MOUTH EVERY DAY (Patient taking differently: Take 60 mg by mouth daily.) 90 capsule 1  . dorzolamide-timolol (COSOPT) 22.3-6.8 MG/ML ophthalmic solution Place 1 drop into both eyes daily.    . Insulin Infusion Pump DEVI by Does not apply route. Basal rate 225 ( not sure) Novolog    . letrozole (FEMARA) 2.5 MG tablet Take 2.5 mg by mouth daily.    . losartan (COZAAR) 25 MG tablet Take 25 mg by mouth daily as needed (If blood pressure is over 140).     . mirtazapine (REMERON SOL-TAB) 15 MG disintegrating tablet Take 15 mg by mouth at  bedtime.    . ondansetron (ZOFRAN-ODT) 4 MG disintegrating tablet Take 4 mg by mouth every 8 (eight) hours as needed for nausea or vomiting.    . sertraline (ZOLOFT) 100 MG tablet Take 150 mg by mouth daily.     . traMADol (ULTRAM) 50 MG tablet Take 50 mg by mouth every 6 (six) hours as needed for severe pain.    . insulin degludec (TRESIBA FLEXTOUCH) 100 UNIT/ML FlexTouch Pen Inject into the skin daily.    . Lactulose 20 GM/30ML SOLN Take 30 mL (20 g total) by mouth 2-3 times daily. Goal of 3-4 BMs daily. 946 mL 1   No current facility-administered medications for this visit.    Allergies as of 06/11/2020 - Review Complete 06/11/2020  Allergen Reaction Noted  . Enoxaparin Other (See Comments) 06/15/2019  . Ivp dye [iodinated diagnostic agents] Other (See Comments) 06/15/2019  . Penicillins Rash 06/15/2019    Family History  Problem Relation Age of Onset  . Colon cancer Neg Hx     Social History   Socioeconomic History  . Marital status: Single    Spouse name: Not on file  . Number of children: Not on file  . Years of education: Not on file  . Highest education level: Not on file  Occupational History  . Not on file  Tobacco Use  . Smoking status: Former Smoker  . Smokeless tobacco: Never Used  . Tobacco comment: Never did smoke much just one or two daily  Substance and Sexual Activity  . Alcohol use: Not Currently  . Drug use: Never  . Sexual activity: Not on file  Other Topics Concern  . Not on file  Social History Narrative  . Not on file   Social Determinants of Health   Financial Resource Strain: Unknown  . Difficulty of Paying Living Expenses: Patient refused  Food Insecurity: No Food Insecurity  . Worried About Running Out of Food in the Last Year: Never true  . Ran Out of Food in the Last Year: Never true  Transportation Needs: No Transportation Needs  . Lack of Transportation (Medical): No  . Lack of Transportation (Non-Medical): No  Physical Activity:  Insufficiently Active  . Days of Exercise per Week: 2 days  . Minutes of Exercise per Session: 10 min  Stress: No Stress Concern Present  . Feeling of Stress : Only a little  Social Connections: Moderately Integrated  . Frequency of Communication with Friends and Family: Patient refused  . Frequency of Social Gatherings with Friends and Family: More than three times a week  . Attends Religious Services: More than 4 times per year  . Active Member of Clubs or Organizations: Yes  . Attends Club or Organization Meetings: More than 4 times per year  .   Marital Status: Never married    Review of Systems: Gen: Denies fever, chills, cold or flu like symptoms.  CV: Denies chest pain or palpitations Resp: See HPI GI: See HPI  Heme: See HPI  Physical Exam: BP 125/67   Pulse 70   Temp (!) 96.9 F (36.1 C) (Temporal)   Ht 5' 2" (1.575 m)   Wt 179 lb 12.8 oz (81.6 kg)   BMI 32.89 kg/m  General:   Alert and oriented x4 though some delay in response time in general. Very weak and appears chronically ill. Much different compared to last visit. Walking with a walker. Required assistance with some weight bearing to get on exam table.   Head:  Normocephalic and atraumatic. Eyes:  Conjuctiva clear without scleral icterus. Heart:  S1, S2 present without murmurs appreciated. Lungs:  Clear to auscultation bilaterally. No wheezes, rales, or rhonchi. No distress.  Abdomen:  +BS, soft, and non-distended. TTP in the epigastric and RUQ region, greatest in epigastric area. Minimal TTP in  LUQ. No rebound or guarding. No HSM or masses noted. Msk:  Symmetrical without gross deformities. Normal posture. Extremities:  Without edema. Neurologic:  Alert and  oriented x4 the patient has some delay in her response time.  No asterixis.  Psych: Normal mood and affect.  Imaging:  Veronica provided me with the following imaging that have been completed through [facility].   CT chest without contrast  05/04/2020 Impression: Limited noncontrast examination.  No significant parenchymal nodularity or lymphadenopathy.  NM bone scan total 05/08/2020 Impression: Likely degenerative disc disease involving the thoracic and lumbar spine.  MRI could be performed if there is significant clinical concern.  No other concerning scintigraphic possibility what activity.  Degenerative disease of the knees bilaterally is incidentally noted.   Assessment: 75-year-old female with history of GERD, dysphagia, postprandial bloating, early satiety, postprandial loose stools, colon cancer around 2006 s/p partial colectomy with last colonoscopy in 2018 with Dr. Candia, due for repeat in 2023, and diagnosed with hepatocellular carcinoma with background changes consistent with cirrhosis in December 2021.  She is presenting today for follow-up with multiple concerns including upper abdominal pain, nausea, vomiting, worsening early satiety, black stool in early February, change in mental status. Also with significant weakness and fatigue since having COVID in January 2022, now requiring a walker.   Upper abdominal pain: Epigastric and RUQ abdominal pain that waxes and wanes in severity with associated intermittent nausea with vomiting a few times a week, decreased appetite, worsening early satiety, and 14 lb weigh loss in the last 4 months.  No specific trigger. Symptoms can be worsened with meals, but not always.  Reports 1 melanotic stool in early February, denies hematemesis or hematochezia.  Breakthrough GERD symptoms about twice a week on Dexilant.  No regular NSAID use. Last EGD March 2021 with mucosal changes in the esophagus with benign biopsy, nodular proximal gastric mucosa with mild chronic gastritis on biopsy, no H. Pylori. GES normal in April 2021. Abdominal exam with moderate tenderness palpation in the epigastric area, mild tenderness to palpation in the RUQ, minimal TTP in  LUQ.   Suspect upper GI symptoms are  likely secondary to hepatocellular carcinoma.  However, she may also have gastritis, duodenitis, PUD, gastric outlet obstruction, gastric malignancy. Doubt pancreatitis. We will proceed with EGD for further evaluation. Will also check CBC, CMP, Lipase.   Dysphagia: Rare dysphagia. Prior EGD with dilation in March 2021 with mucosal changes in the esophagus (benign biopsy) s/p empiric dilation.    Patient had no significant clinical improvement following dilation.  BPE April 2021 with normal exam.  She does have history of stroke and previously questioned oropharyngeal weakness and offered SLP referral, but patient declined.  As we are pursuing EGD for upper GI symptoms, will add on possible dilation if any obvious esophageal abnormality is noted including web, ring, or stricture. Otherwise, suspect oropharyngeal component.  Fatigue/weakness: Likely multifactorial in the setting of COVID-19 about 1 month ago, hepatocellular carcinoma, decreased oral intake, and cannot rule out anemia with reported upper GI symptoms and a melanotic stool. Planning to update labs and proceed with EGD.   Cirrhosis: Diagnosed December 2021 via biopsy of liver lesion.  Biopsy consistent with hepatocellular carcinoma with background changes of cirrhosis.  Previous ordered labs to calculate MELD, but these have not been completed. Mild Alk phos elevation at 151 in May 2021, but GGT within normal limits.  No history of IV or intranasal drug use or significant alcohol use. 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 Clinically, query mild hepatic encephalopathy vs brain fog following COVID in January. A&O x4, but some delay in response time.  No asterixis. She has had decreased BMs and mild intermittent constipation which may be secondary to decreased oral intake. We will plan to update routine cirrhosis labs, check ammonia, and evaluate for Hepatitis A, B, C. Prior EGD in March 2021 with no  varices.  Would recommend updating EGD considering new diagnosis of cirrhosis.  Hepatocellular carcinoma: Diagnosed in December 2021.  She is following with her oncologist, [facility]intunde [facility]kin[facility]e, [facility] with whom she is well-established due to history of breast cancer and colon cancer.  Per patient's legal guardian, she has been diagnosed with stage III liver cancer.  States she has been referred to [facility] with plans for TACE, but they have not heard anything in regards to an appointment with [facility].  Last colonoscopy in 2018 with Dr. Pandya, due for surveillance in 2023.  Discussed possibly updating colonoscopy this year considering new diagnosis of hepatocellular carcinoma; however, due to significant upper GI symptoms, fatigue, weakness, patient would not tolerate colon prep at this time.    Plan:  1.  CBC, CMP, Lipase, INR, AFP, ammonia, hepatitis A antibody total, hepatitis B surface antigen, hepatitis B surface antibody, hepatitis B core antibody, hepatitis C antibody. 2.  Proceed with EGD with propofol with Dr. Rourk in the near future. The risks, benefits, and alternatives have been discussed with the patient in detail. The patient states understanding and desires to proceed.  ASA III See separate instructions for diabetes medication adjustments. No iron x7 days prior to procedure. 3.  Advise she call her oncologist to follow-up on referral to [facility] ASAP. 4.  Continue Dexilant 60 mg daily for now.  May need to change medication pending EGD findings. 5.  Counseled on GERD diet/lifestyle. 6.  Continue Zofran as needed. 7.  Try eating 4-6 small meals daily to help maintain adequate nutritional intake. 8.  Add Ensure or boost twice daily to help supplement nutritional intake. 9.  No more than 2000 mg sodium/day. 10.  No more than 2000 mg Tylenol/day 11.  Add Benefiber daily to help with bowel regularity.  May need to add lactulose pending lab results. 12.  Further recommendations  to follow lab results.    Tamarra Geiselman, PA-C [facility]  

## 2020-06-09 NOTE — H&P (View-Only) (Signed)
Referring Provider: Michell Heinrich, DO Primary Care Physician:  Michell Heinrich, DO Primary GI Physician: Dr. Gala Romney  Chief Complaint  Patient presents with  . Abdominal Pain    Daily; has liver cancer  . Gastroesophageal Reflux    some  . Nausea    Occ vomiting; no appetite    HPI:   Veronica Wiley is a 75 y.o. female presenting today for follow-up, last seen in our office in October 2021. History of GERD, dysphagia s/p dilation in March 2021 with no improvement, BPE April 2021 with normal exam, with history of stroke, questioned oropharyngeal weakness and offered SLP referral, but patient declined. History of postprandial bloating, early satiety, postprandial loose stools, colon cancer around 2006 with partial colectomy.   EGD March 2021 with mucosal changes in the esophagus (benign biopsy) but otherwise normal-appearing s/p empiric dilation, nodular proximal gastric mucosa (biopsy with mild chronic gastritis, no H. Pylori).  Colonoscopy in 2018 with Dr. Holley Raring hemorrhoids, diverticulosis, and two hyperplastic polyps.  Recommended repeat in 2023. TSH normal.  Celiac serologies negative.  GES normal.  RUQ ultrasound April 2021 with normal-appearing gallbladder and fatty liver.  Underwent CT A/P without contrast 03/02/2020 due to intermittent lower abdominal pain and TTP in LLQ with indeterminate liver lesion, indeterminate right adnexal lesion, and indeterminate nodular-like density in the left lower lung lobe.  MRI abdomen and pelvis with and without contrast again with indeterminate liver lesion with recommendations for tissue diagnosis, complex cystic lesion along vaginal cuff.  Due to indeterminate characteristics, stated surgical evaluation should be considered versus sonographic surveillance.  She was scheduled for liver biopsy and referred to OB/GYN.  Liver biopsy consistent with hepatocellular carcinoma and background changes consistent with cirrhosis.  Recommended following up  with her current oncologist, Berton Mount, MD at [facility], [place].  Also ordered labs for cirrhosis, AFP, and hepatitis serologies.  Underwent pelvic/transvaginal ultrasound 05/31/2020 with Dr. Elonda Husky with 3.3 X 2.8 X  2.3 cm hypoechoic, homogeneous right adnexal mass with a small amount of color flow,simple right adnexal fluid surrounding right adnexal mass,left ovary not visualized,right adnexal pain during ultrasound. Dr. 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  Plan to update CA 125.    CA-125 has not been completed and cirrhosis labs/hepatitis labs have not been completed.  Today: Patient presents today with her caregiver and legal guardian Landis Gandy. Patient has declined quite a bit since I saw her last. She is now using a walker, has significant weakness, and appears chronically ill today. Had [place] in January. This is when the weakness and fatigue started.Only able to walk a few steps at a time.  Some shortness of breath when walking. Ongoing cough since COVID. She has seen PCP since COVID. No fever. She actually wasn't very sick when she had COVID. Fatigue was her main symptom. Did not require hospitalization. States PCP wants to get her in physical therapy but trying to get to [facility] first.   Hepatocellular carcinoma: Clearnce Hasten states patient has been diagnosed with stage 3 liver cancer. States she has 1 large tumor and 2 small tumors. Supposed to undergo TACE at [facility]. She has been referred but hasn't heard anything. Referral was placed on 2/4. [facility] called [facility] last week to try to follow-up on this, but wasn't able to get anywhere. States she was on the phone for hours. She was told [facility] has the referral.   Patient has a lot of anxiety  surrounding diagnosis of hepatocellular carcinoma. Not eating well. Upper abdominal pain started in January. Pain is in the mid upper abdomen and right side. Sometimes it will  hurt all day. Other days, it "feels funny." Can be 10/10.  No specific trigger. Symptoms can be worsened with meals, but not always. Associated nausea. Decreased appetite. Worsening early satiety. Nausea can be associated with eating, but not always. Nausea 2-3 times a week. Vomiting 1-2 times a week. No hematemesis or coffee ground emesis. Had black stool in early February x1. No bright red blood in her stool. Not taking oral iron. No pepto bismol. GERD symptoms about twice a week. Currently on Dexilant. Has been on Protonix previously.  Continues with postprandial bloating when she does eat. No regular NSAID use.   Bowels are not moving as also used to.  BMs every 3 days.  She attributes this to decreased oral intake.  Stools may be loose or hard.  Cirrhosis: Some difficulty thinking at times and losing her train of thought. Clearnce Hasten states she will just stare off at times. No scleral icterus. No swelling in abdomen or LE. No bruising.   Dysphagia: Very rare.    Discussed possibility of updating colonoscopy considering new diagnosis of hepatocellular carcinoma, but due to upper GI symptoms and weakness/fatigue, patient will not tolerate prep at this time.   Past Medical History:  Diagnosis Date  . Adjustment disorder with depressed mood   . Cirrhosis ([place]) 03/2020  . Dysphagia   . GERD (gastroesophageal reflux disease)   . Hepatocellular carcinoma ([place]) 03/2020  . History of breast cancer 2020  . HTN (hypertension)   . Hyperlipidemia   . Malignant tumor of colon ([place])    Around 2006  . OSA (obstructive sleep apnea)    on CPAP  . 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 Type 2 diabetes mellitus with complications ([place])   . Vitamin D deficiency     Past Surgical History:  Procedure Laterality Date  . BIOPSY  07/01/2019   Procedure: BIOPSY;  Surgeon: Daneil Dolin, MD;  [facility]: [facility];  Service: Endoscopy;;  esophageal, gastric  . BREAST LUMPECTOMY  Right 2020   x3  . COLON RESECTION     about 15 years ago; left-sided (per patient) (documented 07/2019)  . COLONOSCOPY  07/28/2016   Dr. Earley Brooke; hemorrhoids, diverticulosis (few in the sigmoid colon), colon polyps (two-3 mm polyps biopsied from the rectum).  Pathology revealed hyperplastic polyp.  Next colonoscopy due in 2023.  Marland Kitchen ESOPHAGOGASTRODUODENOSCOPY  05/21/2016   Dr. Earley Brooke; hiatal hernia, polyps (2-3 mm inflammatory looking polyps in the antrum, biopsies taken).  Esophageal balloon dilation performed.  . ESOPHAGOGASTRODUODENOSCOPY N/A 07/01/2019   Procedure: ESOPHAGOGASTRODUODENOSCOPY (EGD);  Surgeon: Daneil Dolin, MD;   mucosal changes in the esophagus (benign biopsy) but otherwise normal-appearing s/p empiric dilation, nodular proximal gastric mucosa (biopsy with mild chronic gastritis, no H. Pylori).    Marland Kitchen MALONEY DILATION N/A 07/01/2019   Procedure: Venia Minks DILATION;  Surgeon: Daneil Dolin, MD;  Location: [facility];  Service: Endoscopy;  Laterality: N/A;    Current Outpatient Medications  Medication Sig Dispense Refill  . aspirin EC 81 MG tablet Take 81 mg by mouth daily.    Marland Kitchen atorvastatin (LIPITOR) 80 MG tablet Take 80 mg by mouth daily.    . Cholecalciferol (VITAMIN D3) 50 MCG (2000 UT) capsule Take 2,000 Units by mouth daily.    . clopidogrel (PLAVIX) 75 MG  tablet Take 75 mg by mouth daily.    Marland Kitchen DEXILANT 60 MG capsule TAKE 1 CAPSULE BY MOUTH EVERY DAY (Patient taking differently: Take 60 mg by mouth daily.) 90 capsule 1  . dorzolamide-timolol (COSOPT) 22.3-6.8 MG/ML ophthalmic solution Place 1 drop into both eyes daily.    . Insulin Infusion Pump DEVI by Does not apply route. Basal rate 225 ( not sure) Novolog    . letrozole (FEMARA) 2.5 MG tablet Take 2.5 mg by mouth daily.    Marland Kitchen losartan (COZAAR) 25 MG tablet Take 25 mg by mouth daily as needed (If blood pressure is over 140).     . mirtazapine (REMERON SOL-TAB) 15 MG disintegrating tablet Take 15 mg by mouth at  bedtime.    . ondansetron (ZOFRAN-ODT) 4 MG disintegrating tablet Take 4 mg by mouth every 8 (eight) hours as needed for nausea or vomiting.    . sertraline (ZOLOFT) 100 MG tablet Take 150 mg by mouth daily.     . traMADol (ULTRAM) 50 MG tablet Take 50 mg by mouth every 6 (six) hours as needed for severe pain.    Marland Kitchen insulin degludec (TRESIBA FLEXTOUCH) 100 UNIT/ML FlexTouch Pen Inject into the skin daily.    . Lactulose 20 GM/30ML SOLN Take 30 mL (20 g total) by mouth 2-3 times daily. Goal of 3-4 BMs daily. 946 mL 1   No current facility-administered medications for this visit.    Allergies as of 06/11/2020 - Review Complete 06/11/2020  Allergen Reaction Noted  . Enoxaparin Other (See Comments) 06/15/2019  . Ivp dye [iodinated diagnostic agents] Other (See Comments) 06/15/2019  . Penicillins Rash 06/15/2019    Family History  Problem Relation Age of Onset  . Colon cancer Neg Hx     Social History   Socioeconomic History  . Marital status: Single    Spouse name: Not on file  . Number of children: Not on file  . Years of education: Not on file  . Highest education level: Not on file  Occupational History  . Not on file  Tobacco Use  . Smoking status: Former Research scientist (life sciences)  . Smokeless tobacco: Never Used  . Tobacco comment: Never did smoke much just one or two daily  Substance and Sexual Activity  . Alcohol use: Not Currently  . Drug use: Never  . Sexual activity: Not on file  Other Topics Concern  . Not on file  Social History Narrative  . Not on file   Social Determinants of Health   Financial Resource Strain: Unknown  . Difficulty of Paying Living Expenses: Patient refused  Food Insecurity: No Food Insecurity  . Worried About Charity fundraiser in the Last Year: Never true  . Ran Out of Food in the Last Year: Never true  Transportation Needs: No Transportation Needs  . Lack of Transportation (Medical): No  . Lack of Transportation (Non-Medical): No  Physical Activity:  Insufficiently Active  . Days of Exercise per Week: 2 days  . Minutes of Exercise per Session: 10 min  Stress: No Stress Concern Present  . Feeling of Stress : Only a little  Social Connections: Moderately Integrated  . Frequency of Communication with Friends and Family: Patient refused  . Frequency of Social Gatherings with Friends and Family: More than three times a week  . Attends Religious Services: More than 4 times per year  . Active Member of Clubs or Organizations: Yes  . Attends Archivist Meetings: More than 4 times per year  .  Marital Status: Never married    Review of Systems: Gen: Denies fever, chills, cold or flu like symptoms.  CV: Denies chest pain or palpitations Resp: See HPI GI: See HPI  Heme: See HPI  Physical Exam: BP 125/67   Pulse 70   Temp (!) 96.9 F (36.1 C) (Temporal)   Ht _0  (1.575 m)   Wt 179 lb 12.8 oz (81.6 kg)   BMI 32.89 kg/m  General:   Alert and oriented x4 though some delay in response time in general. Very weak and appears chronically ill. Much different compared to last visit. Walking with a walker. Required assistance with some weight bearing to get on exam table.   Head:  Normocephalic and atraumatic. Eyes:  Conjuctiva clear without scleral icterus. Heart:  S1, S2 present without murmurs appreciated. Lungs:  Clear to auscultation bilaterally. No wheezes, rales, or rhonchi. No distress.  Abdomen:  +BS, soft, and non-distended. TTP in the epigastric and RUQ region, greatest in epigastric area. Minimal TTP in  LUQ. No rebound or guarding. No HSM or masses noted. Msk:  Symmetrical without gross deformities. Normal posture. Extremities:  Without edema. Neurologic:  Alert and  oriented x4 the patient has some delay in her response time.  No asterixis.  Psych: Normal mood and affect.  Imaging:  [facility] provided me with the following imaging that have been completed through [facility].   CT chest without contrast  05/04/2020 Impression: Limited noncontrast examination.  No significant parenchymal nodularity or lymphadenopathy.  NM bone scan total 05/08/2020 Impression: Likely degenerative disc disease involving the thoracic and lumbar spine.  MRI could be performed if there is significant clinical concern.  No other concerning scintigraphic possibility what activity.  Degenerative disease of the knees bilaterally is incidentally noted.   Assessment: 75 year old female with history of GERD, dysphagia, postprandial bloating, early satiety, postprandial loose stools, colon cancer around 2006 s/p partial colectomy with last colonoscopy in 2018 with Dr. Faye Ramsay, due for repeat in 2023, and diagnosed with hepatocellular carcinoma with background changes consistent with cirrhosis in December 2021.  She is presenting today for follow-up with multiple concerns including upper abdominal pain, nausea, vomiting, worsening early satiety, black stool in early February, change in mental status. Also with significant weakness and fatigue since having COVID in January 2022, now requiring a walker.   Upper abdominal pain: Epigastric and RUQ abdominal pain that waxes and wanes in severity with associated intermittent nausea with vomiting a few times a week, decreased appetite, worsening early satiety, and 14 lb weigh loss in the last 4 months.  No specific trigger. Symptoms can be worsened with meals, but not always.  Reports 1 melanotic stool in early February, denies hematemesis or hematochezia.  Breakthrough GERD symptoms about twice a week on Dexilant.  No regular NSAID use. Last EGD March 2021 with mucosal changes in the esophagus with benign biopsy, nodular proximal gastric mucosa with mild chronic gastritis on biopsy, no H. Pylori. GES normal in April 2021. Abdominal exam with moderate tenderness palpation in the epigastric area, mild tenderness to palpation in the RUQ, minimal TTP in  LUQ.   Suspect upper GI symptoms are  likely secondary to hepatocellular carcinoma.  However, she may also have gastritis, duodenitis, PUD, gastric outlet obstruction, gastric malignancy. Doubt pancreatitis. We will proceed with EGD for further evaluation. Will also check CBC, CMP, Lipase.   Dysphagia: Rare dysphagia. Prior EGD with dilation in March 2021 with mucosal changes in the esophagus (benign biopsy) s/p empiric dilation.  Patient had no significant clinical improvement following dilation.  BPE April 2021 with normal exam.  She does have history of stroke and previously questioned oropharyngeal weakness and offered SLP referral, but patient declined.  As we are pursuing EGD for upper GI symptoms, will add on possible dilation if any obvious esophageal abnormality is noted including web, ring, or stricture. Otherwise, suspect oropharyngeal component.  Fatigue/weakness: Likely multifactorial in the setting of COVID-19 about 1 month ago, hepatocellular carcinoma, decreased oral intake, and cannot rule out anemia with reported upper GI symptoms and a melanotic stool. Planning to update labs and proceed with EGD.   Cirrhosis: Diagnosed December 2021 via biopsy of liver lesion.  Biopsy consistent with hepatocellular carcinoma with background changes of cirrhosis.  Previous ordered labs to calculate MELD, but these have not been completed. Mild Alk phos elevation at 151 in May 2021, but GGT within normal limits.  No history of IV or intranasal drug use or significant alcohol use. 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 Clinically, query mild hepatic encephalopathy vs brain fog following COVID in January. A&O x4, but some delay in response time.  No asterixis. She has had decreased BMs and mild intermittent constipation which may be secondary to decreased oral intake. We will plan to update routine cirrhosis labs, check ammonia, and evaluate for Hepatitis A, B, C. Prior EGD in March 2021 with no  varices.  Would recommend updating EGD considering new diagnosis of cirrhosis.  Hepatocellular carcinoma: Diagnosed in December 2021.  She is following with her oncologist, Berton Mount, [place] with whom she is well-established due to history of breast cancer and colon cancer.  Per patient's legal guardian, she has been diagnosed with stage III liver cancer.  States she has been referred to [facility] with plans for TACE, but they have not heard anything in regards to an appointment with [facility].  Last colonoscopy in 2018 with Dr. Earley Brooke, due for surveillance in 2023.  Discussed possibly updating colonoscopy this year considering new diagnosis of hepatocellular carcinoma; however, due to significant upper GI symptoms, fatigue, weakness, patient would not tolerate colon prep at this time.    Plan:  1.  CBC, CMP, Lipase, INR, AFP, ammonia, hepatitis A antibody total, hepatitis B surface antigen, hepatitis B surface antibody, hepatitis B core antibody, hepatitis C antibody. 2.  Proceed with EGD with propofol with Dr. Gala Romney in the near future. The risks, benefits, and alternatives have been discussed with the patient in detail. The patient states understanding and desires to proceed.  ASA III See separate instructions for diabetes medication adjustments. No iron x7 days prior to procedure. 3.  Advise she call her oncologist to follow-up on referral to [facility] ASAP. 4.  Continue Dexilant 60 mg daily for now.  May need to change medication pending EGD findings. 5.  Counseled on GERD diet/lifestyle. 6.  Continue Zofran as needed. 7.  Try eating 4-6 small meals daily to help maintain adequate nutritional intake. 8.  Add Ensure or boost twice daily to help supplement nutritional intake. 9.  No more than 2000 mg sodium/day. 10.  No more than 2000 mg Tylenol/day 11.  Add Benefiber daily to help with bowel regularity.  May need to add lactulose pending lab results. 12.  Further recommendations  to follow lab results.    [name], PA-C [facility]

## 2020-06-11 ENCOUNTER — Other Ambulatory Visit: Payer: Self-pay

## 2020-06-11 ENCOUNTER — Encounter: Payer: Self-pay | Admitting: *Deleted

## 2020-06-11 ENCOUNTER — Ambulatory Visit (INDEPENDENT_AMBULATORY_CARE_PROVIDER_SITE_OTHER): Payer: Medicare Other | Admitting: Gastroenterology

## 2020-06-11 ENCOUNTER — Encounter: Payer: Self-pay | Admitting: Gastroenterology

## 2020-06-11 VITALS — BP 125/67 | HR 70 | Temp 96.9°F | Ht 62.0 in | Wt 179.8 lb

## 2020-06-11 DIAGNOSIS — R5383 Other fatigue: Secondary | ICD-10-CM

## 2020-06-11 DIAGNOSIS — K219 Gastro-esophageal reflux disease without esophagitis: Secondary | ICD-10-CM

## 2020-06-11 DIAGNOSIS — R1010 Upper abdominal pain, unspecified: Secondary | ICD-10-CM

## 2020-06-11 DIAGNOSIS — K7460 Unspecified cirrhosis of liver: Secondary | ICD-10-CM

## 2020-06-11 DIAGNOSIS — K921 Melena: Secondary | ICD-10-CM

## 2020-06-11 DIAGNOSIS — C220 Liver cell carcinoma: Secondary | ICD-10-CM

## 2020-06-11 DIAGNOSIS — R1310 Dysphagia, unspecified: Secondary | ICD-10-CM | POA: Diagnosis not present

## 2020-06-11 NOTE — Patient Instructions (Addendum)
Please have labs completed at [facility] today.   Please call your cancer doctor to follow-up on referral to [facility].  We will arrange for you to have an upper endoscopy in the near future with Dr. Gala Romney. 1 day prior to procedure: Take one half dose of [place]. Day of procedure: Do not take any morning diabetes medications. Do not take iron for 7 days prior to procedure.   Continue Zofran as needed.  You may take this medication every 8 hours for nausea or vomiting.  Continue Dexilant 60 mg daily.  Try eating 4-6 small meals daily to help maintain adequate nutritional intake.  Add Ensure or boost twice daily to help supplement oral intake.  Be sure to drink enough to keep your urine pale yellow to clear.  Follow a GERD diet:  Avoid fried, fatty, greasy, spicy, citrus foods. Avoid caffeine and carbonated beverages. Avoid chocolate. Do not eat within 3 hours of laying down. Prop head of bed up on wood or bricks to create a 6 inch incline.  For cirrhosis: We are updating labs. If taking Tylenol, do not exceed more than 2000 mg daily. Limit sodium intake.  No more than 2000 mg daily. Monitor for swelling in your abdomen or lower extremities, worsening confusion or change in mental status, bright red blood per rectum, black stools, yellowing of the eyes and let us know if this occurs.  To help with bowel regularity, try adding benefiber daily.   We will see you back after your upper endoscopy.  Do not hesitate to call if you have any questions or concerns prior.  [name], PA-C [facility]

## 2020-06-12 ENCOUNTER — Other Ambulatory Visit: Payer: Self-pay | Admitting: Gastroenterology

## 2020-06-12 DIAGNOSIS — R4182 Altered mental status, unspecified: Secondary | ICD-10-CM

## 2020-06-12 DIAGNOSIS — E7220 Disorder of urea cycle metabolism, unspecified: Secondary | ICD-10-CM

## 2020-06-12 LAB — CBC WITH DIFFERENTIAL/PLATELET
Basophils Absolute: 0 10*3/uL (ref 0.0–0.2)
Basos: 0 %
EOS (ABSOLUTE): 0.1 10*3/uL (ref 0.0–0.4)
Eos: 1 %
Hematocrit: 33.7 % — ABNORMAL LOW (ref 34.0–46.6)
Hemoglobin: 10.3 g/dL — ABNORMAL LOW (ref 11.1–15.9)
Immature Grans (Abs): 0 10*3/uL (ref 0.0–0.1)
Immature Granulocytes: 0 %
Lymphocytes Absolute: 1.8 10*3/uL (ref 0.7–3.1)
Lymphs: 15 %
MCH: 25.4 pg — ABNORMAL LOW (ref 26.6–33.0)
MCHC: 30.6 g/dL — ABNORMAL LOW (ref 31.5–35.7)
MCV: 83 fL (ref 79–97)
Monocytes Absolute: 0.9 10*3/uL (ref 0.1–0.9)
Monocytes: 7 %
Neutrophils Absolute: 9.2 10*3/uL — ABNORMAL HIGH (ref 1.4–7.0)
Neutrophils: 77 %
Platelets: 358 10*3/uL (ref 150–450)
RBC: 4.06 x10E6/uL (ref 3.77–5.28)
RDW: 14 % (ref 11.7–15.4)
WBC: 12 10*3/uL — ABNORMAL HIGH (ref 3.4–10.8)

## 2020-06-12 LAB — COMPREHENSIVE METABOLIC PANEL
ALT: 16 IU/L (ref 0–32)
AST: 30 IU/L (ref 0–40)
Albumin/Globulin Ratio: 1 — ABNORMAL LOW (ref 1.2–2.2)
Albumin: 3.5 g/dL — ABNORMAL LOW (ref 3.7–4.7)
Alkaline Phosphatase: 294 IU/L — ABNORMAL HIGH (ref 44–121)
BUN/Creatinine Ratio: 10 — ABNORMAL LOW (ref 12–28)
BUN: 5 mg/dL — ABNORMAL LOW (ref 8–27)
Bilirubin Total: 1.1 mg/dL (ref 0.0–1.2)
CO2: 24 mmol/L (ref 20–29)
Calcium: 9.8 mg/dL (ref 8.7–10.3)
Chloride: 92 mmol/L — ABNORMAL LOW (ref 96–106)
Creatinine, Ser: 0.52 mg/dL — ABNORMAL LOW (ref 0.57–1.00)
GFR calc Af Amer: 108 mL/min/{1.73_m2} (ref >59)
GFR calc non Af Amer: 94 mL/min/{1.73_m2} (ref >59)
Globulin, Total: 3.6 g/dL (ref 1.5–4.5)
Glucose: 250 mg/dL — ABNORMAL HIGH (ref 65–99)
Potassium: 4.3 mmol/L (ref 3.5–5.2)
Sodium: 135 mmol/L (ref 134–144)
Total Protein: 7.1 g/dL (ref 6.0–8.5)

## 2020-06-12 LAB — HEPATITIS A ANTIBODY, TOTAL: hep A Total Ab: NEGATIVE

## 2020-06-12 LAB — HEPATITIS B SURFACE ANTIGEN: Hepatitis B Surface Ag: NEGATIVE

## 2020-06-12 LAB — AFP TUMOR MARKER: AFP, Serum, Tumor Marker: 38091 ng/mL — ABNORMAL HIGH (ref 0.0–8.3)

## 2020-06-12 LAB — PROTIME-INR
INR: 1.2 (ref 0.9–1.2)
Prothrombin Time: 12 s (ref 9.1–12.0)

## 2020-06-12 LAB — HEPATITIS B SURFACE ANTIBODY,QUALITATIVE: Hep B Surface Ab, Qual: NONREACTIVE

## 2020-06-12 LAB — HEPATITIS C ANTIBODY: Hep C Virus Ab: <0.1 s/co ratio (ref 0.0–0.9)

## 2020-06-12 LAB — CA 125: Cancer Antigen (CA) 125: 36 U/mL (ref 0.0–38.1)

## 2020-06-12 LAB — LIPASE: Lipase: 16 U/L (ref 14–85)

## 2020-06-12 LAB — AMMONIA: Ammonia: 157 ug/dL (ref 31–169)

## 2020-06-12 LAB — HEPATITIS B CORE ANTIBODY, TOTAL: Hep B Core Total Ab: NEGATIVE

## 2020-06-12 MED ORDER — LACTULOSE 20 GM/30ML PO SOLN: ORAL | 1 refills | Status: AC

## 2020-06-13 ENCOUNTER — Other Ambulatory Visit: Payer: Self-pay

## 2020-06-13 ENCOUNTER — Other Ambulatory Visit: Payer: Self-pay | Admitting: *Deleted

## 2020-06-13 ENCOUNTER — Telehealth: Payer: Self-pay | Admitting: Internal Medicine

## 2020-06-13 DIAGNOSIS — R1011 Right upper quadrant pain: Secondary | ICD-10-CM

## 2020-06-13 DIAGNOSIS — R748 Abnormal levels of other serum enzymes: Secondary | ICD-10-CM

## 2020-06-13 DIAGNOSIS — R112 Nausea with vomiting, unspecified: Secondary | ICD-10-CM

## 2020-06-13 DIAGNOSIS — D72829 Elevated white blood cell count, unspecified: Secondary | ICD-10-CM

## 2020-06-13 DIAGNOSIS — Z79899 Other long term (current) drug therapy: Secondary | ICD-10-CM

## 2020-06-13 DIAGNOSIS — R0609 Other forms of dyspnea: Secondary | ICD-10-CM

## 2020-06-13 DIAGNOSIS — Z8616 Personal history of COVID-19: Secondary | ICD-10-CM

## 2020-06-13 DIAGNOSIS — R0600 Dyspnea, unspecified: Secondary | ICD-10-CM

## 2020-06-13 DIAGNOSIS — R059 Cough, unspecified: Secondary | ICD-10-CM

## 2020-06-15 ENCOUNTER — Ambulatory Visit (HOSPITAL_COMMUNITY): Payer: Medicare Other

## 2020-06-17 ENCOUNTER — Encounter: Payer: Self-pay | Admitting: Gastroenterology

## 2020-06-20 NOTE — Patient Instructions (Signed)
Veronica Wiley  06/20/2020     @PREFPERIOPPHARMACY @   Your procedure is scheduled on  06/25/2020.   Report to [facility] at  1000  A.M.   Call this number if you have problems the morning of surgery:  [phone]   Remember:  Follow the diet instructions given to you by the office.                       Take these medicines the morning of surgery with A SIP OF WATER  Dexilant, zofran(if needed), zoloft, tramadol (if needed).  Take 1/2 of your tresiba the night before your procedure.  DO NOT take any medications for diabetes the morning of your procedure.  If your glucose is 70 or below the morning of your procedure, drink 1/2 cup of clear juice and recheck your glucose in 15 minutes. If your glucose is still 70 or below, call [phone] for instructions.   If your glucose is 300 or above, the morning of your procedure, call [phone] for instructions.     Please brush your teeth.  Do not wear jewelry, make-up or nail polish.  Do not wear lotions, powders, or perfumes, or deodorant.  Do not shave 48 hours prior to surgery.  Men may shave face and neck.  Do not bring valuables to the hospital.  [facility] is not responsible for any belongings or valuables.  Contacts, dentures or bridgework may not be worn into surgery.  Leave your suitcase in the car.  After surgery it may be brought to your room.  For patients admitted to the hospital, discharge time will be determined by your treatment team.  Patients discharged the day of surgery will not be allowed to drive home and must have someone with them for 24 hours.   Special instructions:  DO NOT smoke tobacco or vape the morning of your procedure.   Please read over the following fact sheets that you were given. Anesthesia Post-op Instructions and Care and Recovery After Surgery       Upper Endoscopy, Adult, Care After This sheet gives you information about how to care for yourself after your  procedure. Your health care provider may also give you more specific instructions. If you have problems or questions, contact your health care provider. What can I expect after the procedure? After the procedure, it is common to have:  A sore throat.  Mild stomach pain or discomfort.  Bloating.  Nausea. Follow these instructions at home:  Follow instructions from your health care provider about what to eat or drink after your procedure.  Return to your normal activities as told by your health care provider. Ask your health care provider what activities are safe for you.  Take over-the-counter and prescription medicines only as told by your health care provider.  If you were given a sedative during the procedure, it can affect you for several hours. Do not drive or operate machinery until your health care provider says that it is safe.  Keep all follow-up visits as told by your health care provider. This is important.   Contact a health care provider if you have:  A sore throat that lasts longer than one day.  Trouble swallowing. Get help right away if:  You vomit blood or your vomit looks like coffee grounds.  You have: ? A fever. ? Bloody, black, or tarry stools. ? A severe sore throat or you cannot  swallow. ? Difficulty breathing. ? Severe pain in your chest or abdomen. Summary  After the procedure, it is common to have a sore throat, mild stomach discomfort, bloating, and nausea.  If you were given a sedative during the procedure, it can affect you for several hours. Do not drive or operate machinery until your health care provider says that it is safe.  Follow instructions from your health care provider about what to eat or drink after your procedure.  Return to your normal activities as told by your health care provider. This information is not intended to replace advice given to you by your health care provider. Make sure you discuss any questions you have with  your health care provider. Document Revised: 04/05/2019 Document Reviewed: 09/07/2017 Elsevier Patient Education  2021 [place].  https://www.asge.org/home/for-patients/patient-information/understanding-eso-dilation-updated">  Esophageal Dilatation Esophageal dilatation, also called esophageal dilation, is a procedure to widen or open a blocked or narrowed part of the esophagus. The esophagus is the part of the body that moves food and liquid from the mouth to the stomach. You may need this procedure if:  You have a buildup of scar tissue in your esophagus that makes it difficult, painful, or impossible to swallow. This can be caused by gastroesophageal reflux disease (GERD).  You have cancer of the esophagus.  There is a problem with how food moves through your esophagus. In some cases, you may need this procedure repeated at a later time to dilate the esophagus gradually. Tell a health care provider about:  Any allergies you have.  All medicines you are taking, including vitamins, herbs, eye drops, creams, and over-the-counter medicines.  Any problems you or family members have had with anesthetic medicines.  Any blood disorders you have.  Any surgeries you have had.  Any medical conditions you have.  Any antibiotic medicines you are required to take before dental procedures.  Whether you are pregnant or may be pregnant. What are the risks? Generally, this is a safe procedure. However, problems may occur, including:  Bleeding due to a tear in the lining of the esophagus.  A hole, or perforation, in the esophagus. What happens before the procedure?  Ask your health care provider about: ? Changing or stopping your regular medicines. This is especially important if you are taking diabetes medicines or blood thinners. ? Taking medicines such as aspirin and ibuprofen. These medicines can thin your blood. Do not take these medicines unless your health care provider tells you  to take them. ? Taking over-the-counter medicines, vitamins, herbs, and supplements.  Follow instructions from your health care provider about eating or drinking restrictions.  Plan to have a responsible adult take you home from the hospital or clinic.  Plan to have a responsible adult care for you for the time you are told after you leave the hospital or clinic. This is important. What happens during the procedure?  You may be given a medicine to help you relax (sedative).  A numbing medicine may be sprayed into the back of your throat, or you may gargle the medicine.  Your health care provider may perform the dilatation using various surgical instruments, such as: ? Simple dilators. This instrument is carefully placed in the esophagus to stretch it. ? Guided wire bougies. This involves using an endoscope to insert a wire into the esophagus. A dilator is passed over this wire to enlarge the esophagus. Then the wire is removed. ? Balloon dilators. An endoscope with a small balloon is inserted into the  esophagus. The balloon is inflated to stretch the esophagus and open it up. The procedure may vary among health care providers and hospitals. What can I expect after the procedure?  Your blood pressure, heart rate, breathing rate, and blood oxygen level will be monitored until you leave the hospital or clinic.  Your throat may feel slightly sore and numb. This will get better over time.  You will not be allowed to eat or drink until your throat is no longer numb.  When you are able to drink, urinate, and sit on the edge of the bed without nausea or dizziness, you may be able to return home. Follow these instructions at home:  Take over-the-counter and prescription medicines only as told by your health care provider.  If you were given a sedative during the procedure, it can affect you for several hours. Do not drive or operate machinery until your health care provider says that it is  safe.  Plan to have a responsible adult care for you for the time you are told. This is important.  Follow instructions from your health care provider about any eating or drinking restrictions.  Do not use any products that contain nicotine or tobacco, such as cigarettes, e-cigarettes, and chewing tobacco. If you need help quitting, ask your health care provider.  Keep all follow-up visits. This is important. Contact a health care provider if:  You have a fever.  You have pain that is not relieved by medicine. Get help right away if:  You have chest pain.  You have trouble breathing.  You have trouble swallowing.  You vomit blood.  You have black, tarry, or bloody stools. These symptoms may represent a serious problem that is an emergency. Do not wait to see if the symptoms will go away. Get medical help right away. Call your local emergency services (911 in the [place] Do not drive yourself to the hospital. Summary  Esophageal dilatation, also called esophageal dilation, is a procedure to widen or open a blocked or narrowed part of the esophagus.  Plan to have a responsible adult take you home from the hospital or clinic.  For this procedure, a numbing medicine may be sprayed into the back of your throat, or you may gargle the medicine.  Do not drive or operate machinery until your health care provider says that it is safe. This information is not intended to replace advice given to you by your health care provider. Make sure you discuss any questions you have with your health care provider. Document Revised: 08/24/2019 Document Reviewed: 08/24/2019 Elsevier Patient Education  2021 [place] After This sheet gives you information about how to care for yourself after your procedure. Your health care provider may also give you more specific instructions. If you have problems or questions, contact your health care provider. What can I expect  after the procedure? After the procedure, it is common to have:  Tiredness.  Forgetfulness about what happened after the procedure.  Impaired judgment for important decisions.  Nausea or vomiting.  Some difficulty with balance. Follow these instructions at home: For the time period you were told by your health care provider:  Rest as needed.  Do not participate in activities where you could fall or become injured.  Do not drive or use machinery.  Do not drink alcohol.  Do not take sleeping pills or medicines that cause drowsiness.  Do not make important decisions or sign legal documents.  Do  not take care of children on your own.      Eating and drinking  Follow the diet that is recommended by your health care provider.  Drink enough fluid to keep your urine pale yellow.  If you vomit: ? Drink water, juice, or soup when you can drink without vomiting. ? Make sure you have little or no nausea before eating solid foods. General instructions  Have a responsible adult stay with you for the time you are told. It is important to have someone help care for you until you are awake and alert.  Take over-the-counter and prescription medicines only as told by your health care provider.  If you have sleep apnea, surgery and certain medicines can increase your risk for breathing problems. Follow instructions from your health care provider about wearing your sleep device: ? Anytime you are sleeping, including during daytime naps. ? While taking prescription pain medicines, sleeping medicines, or medicines that make you drowsy.  Avoid smoking.  Keep all follow-up visits as told by your health care provider. This is important. Contact a health care provider if:  You keep feeling nauseous or you keep vomiting.  You feel light-headed.  You are still sleepy or having trouble with balance after 24 hours.  You develop a rash.  You have a fever.  You have redness or swelling  around the IV site. Get help right away if:  You have trouble breathing.  You have new-onset confusion at home. Summary  For several hours after your procedure, you may feel tired. You may also be forgetful and have poor judgment.  Have a responsible adult stay with you for the time you are told. It is important to have someone help care for you until you are awake and alert.  Rest as told. Do not drive or operate machinery. Do not drink alcohol or take sleeping pills.  Get help right away if you have trouble breathing, or if you suddenly become confused. This information is not intended to replace advice given to you by your health care provider. Make sure you discuss any questions you have with your health care provider. Document Revised: 12/22/2019 Document Reviewed: 03/10/2019 Elsevier Patient Education  2021 [employer].

## 2020-06-20 NOTE — Pre-Procedure Instructions (Signed)
Talbot Grumbling, PA confirms that Dr Gala Romney does not want patient to hold plavix, prior to her procedure.

## 2020-06-21 ENCOUNTER — Encounter (HOSPITAL_COMMUNITY)
Admission: RE | Admit: 2020-06-21 | Discharge: 2020-06-21 | Disposition: A | Discharge status: Home / Self Care | Payer: Medicare Other | Source: Ambulatory Visit | Attending: Internal Medicine | Admitting: Internal Medicine

## 2020-06-21 ENCOUNTER — Ambulatory Visit (HOSPITAL_COMMUNITY)
Admission: RE | Admit: 2020-06-21 | Discharge: 2020-06-21 | Disposition: A | Discharge status: Home / Self Care | Payer: Medicare Other | Source: Ambulatory Visit | Attending: Gastroenterology | Admitting: Gastroenterology

## 2020-06-21 ENCOUNTER — Other Ambulatory Visit: Payer: Self-pay

## 2020-06-21 ENCOUNTER — Other Ambulatory Visit (HOSPITAL_COMMUNITY)
Admission: RE | Admit: 2020-06-21 | Discharge: 2020-06-21 | Disposition: A | Discharge status: Home / Self Care | Payer: Medicare Other | Source: Ambulatory Visit | Attending: Internal Medicine | Admitting: Internal Medicine

## 2020-06-21 ENCOUNTER — Encounter (HOSPITAL_COMMUNITY): Payer: Self-pay

## 2020-06-21 DIAGNOSIS — D72829 Elevated white blood cell count, unspecified: Secondary | ICD-10-CM | POA: Diagnosis not present

## 2020-06-21 DIAGNOSIS — Z01812 Encounter for preprocedural laboratory examination: Secondary | ICD-10-CM | POA: Insufficient documentation

## 2020-06-21 DIAGNOSIS — Z20822 Contact with and (suspected) exposure to covid-19: Secondary | ICD-10-CM | POA: Insufficient documentation

## 2020-06-21 DIAGNOSIS — R112 Nausea with vomiting, unspecified: Secondary | ICD-10-CM | POA: Insufficient documentation

## 2020-06-21 DIAGNOSIS — R059 Cough, unspecified: Secondary | ICD-10-CM | POA: Insufficient documentation

## 2020-06-21 DIAGNOSIS — Z01818 Encounter for other preprocedural examination: Secondary | ICD-10-CM | POA: Insufficient documentation

## 2020-06-21 DIAGNOSIS — R748 Abnormal levels of other serum enzymes: Secondary | ICD-10-CM | POA: Insufficient documentation

## 2020-06-21 DIAGNOSIS — R1011 Right upper quadrant pain: Secondary | ICD-10-CM | POA: Diagnosis not present

## 2020-06-21 DIAGNOSIS — R0600 Dyspnea, unspecified: Secondary | ICD-10-CM | POA: Diagnosis present

## 2020-06-21 DIAGNOSIS — Z8616 Personal history of COVID-19: Secondary | ICD-10-CM | POA: Diagnosis present

## 2020-06-21 DIAGNOSIS — R0609 Other forms of dyspnea: Secondary | ICD-10-CM

## 2020-06-21 HISTORY — DX: Depression, unspecified: F32.A

## 2020-06-21 HISTORY — DX: Anxiety disorder, unspecified: F41.9

## 2020-06-21 HISTORY — DX: Unspecified osteoarthritis, unspecified site: M19.90

## 2020-06-21 LAB — FERRITIN: Ferritin: 149 ng/mL (ref 11–307)

## 2020-06-21 LAB — IRON AND TIBC
Iron: 22 ug/dL — ABNORMAL LOW (ref 28–170)
Saturation Ratios: 8 % — ABNORMAL LOW (ref 10.4–31.8)
TIBC: 283 ug/dL (ref 250–450)
UIBC: 261 ug/dL

## 2020-06-21 IMAGING — US US ABDOMEN LIMITED RUQ/ASCITES
1 series · 14 of 25 positions shown · non-contrast
Comparison: MRI abdomen [DATE]

CLINICAL DATA: Right upper quadrant pain

Nausea
Vomiting
Elevated alk phos
Leukocytosis
EXAM:
ULTRASOUND ABDOMEN LIMITED RIGHT UPPER QUADRANT

[Series 1: us abdomen limited ruq (liver/gb) · 14 of 52 slices shown]
[im 1/52]
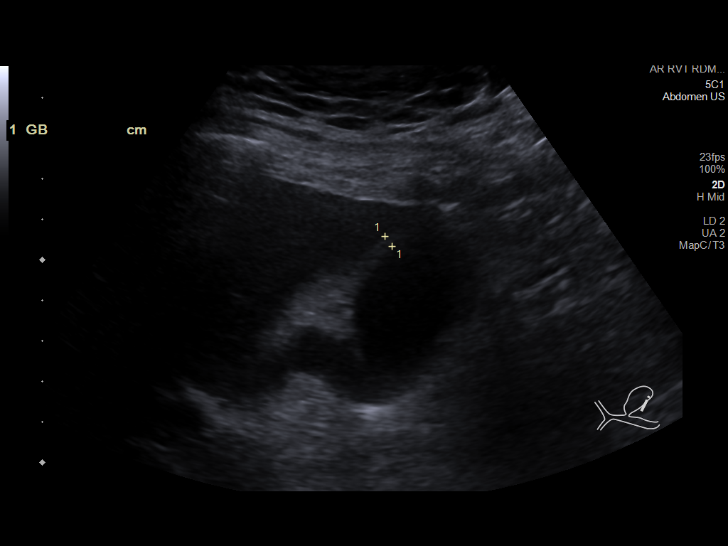
[im 5/52]
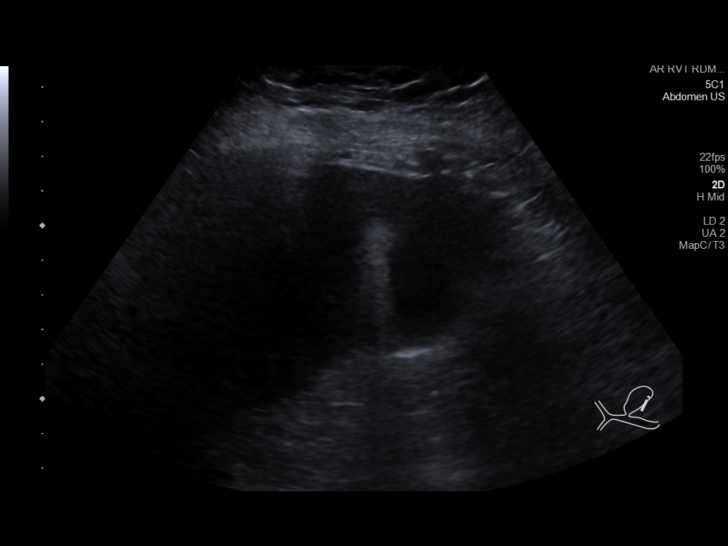
[im 9/52]
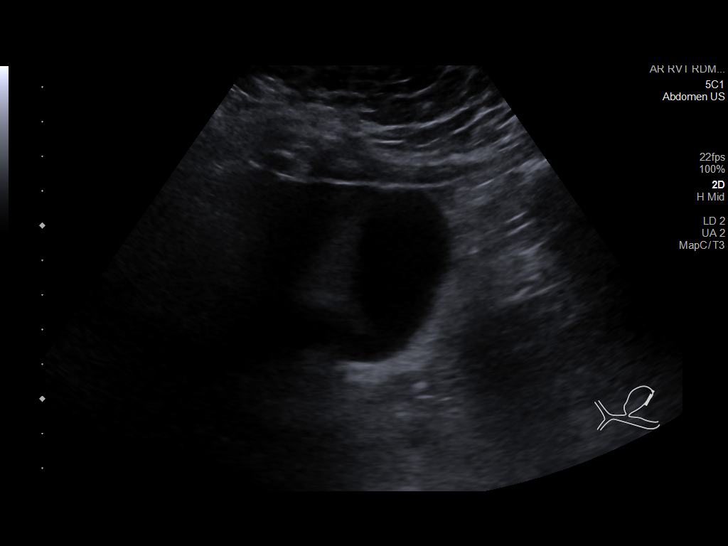
[im 13/52]
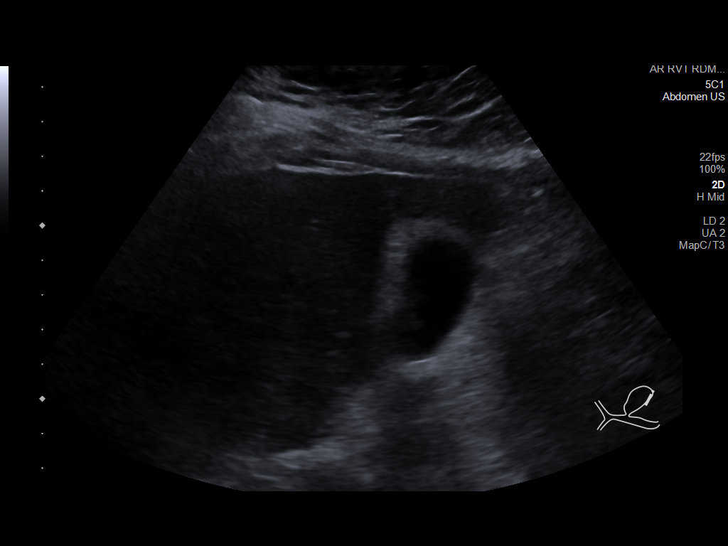
[im 18/52]
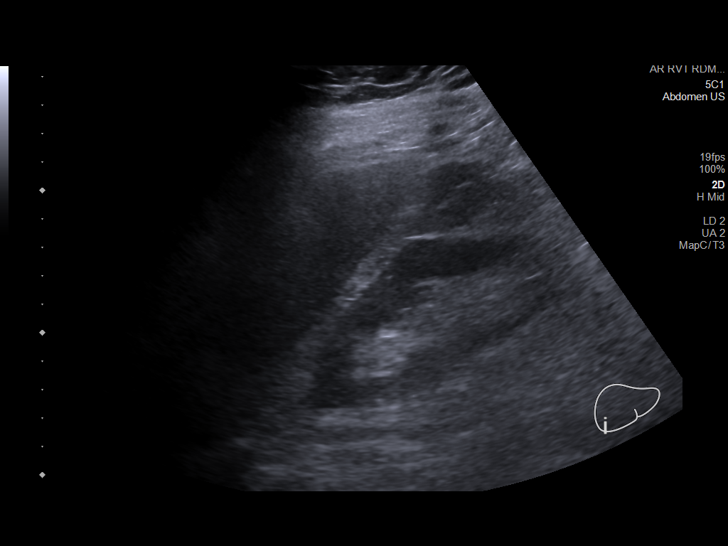
[im 20/52]
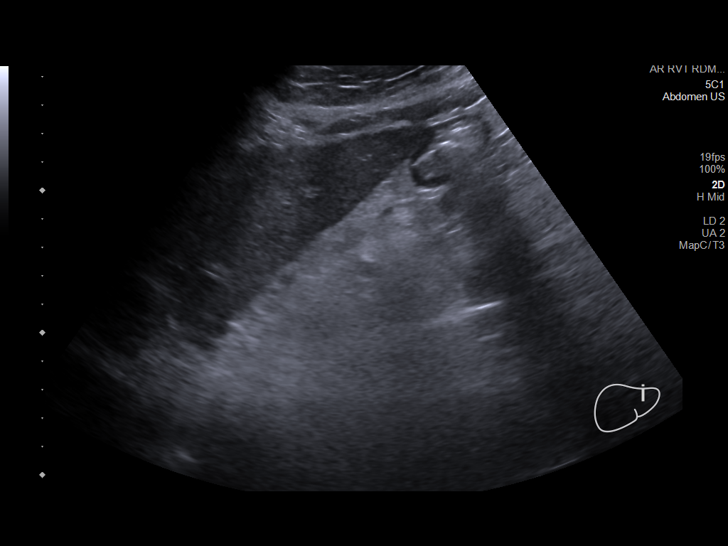
[im 24/52]
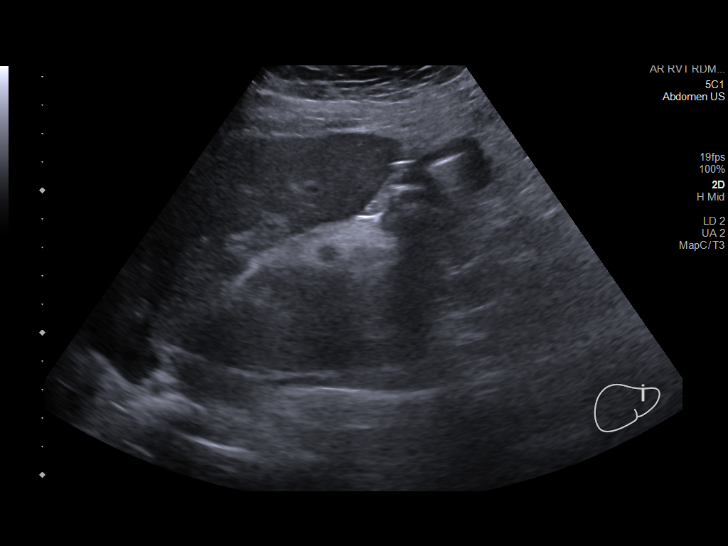
[im 28/52]
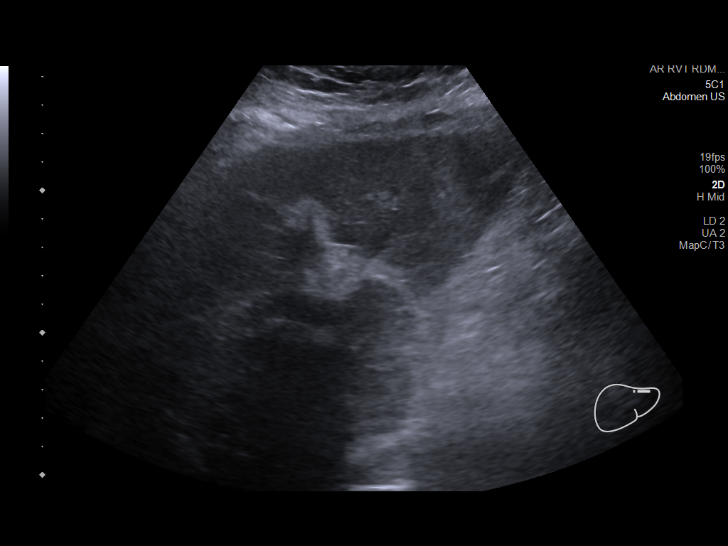
[im 32/52]
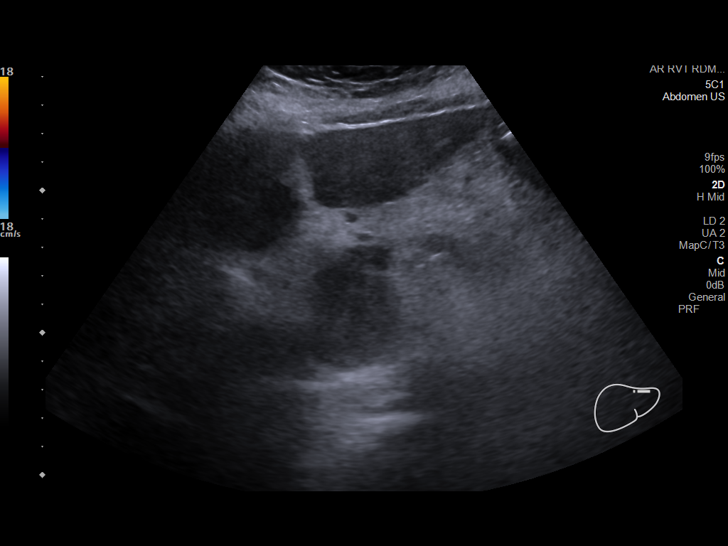
[im 35/52]
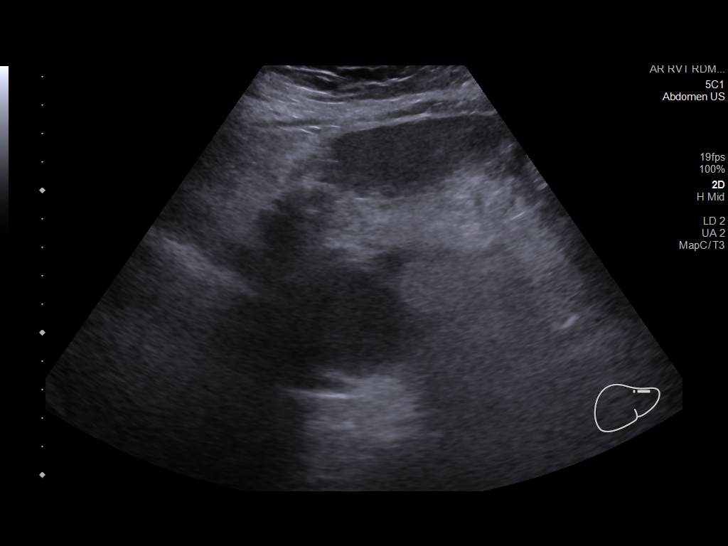
[im 39/52]
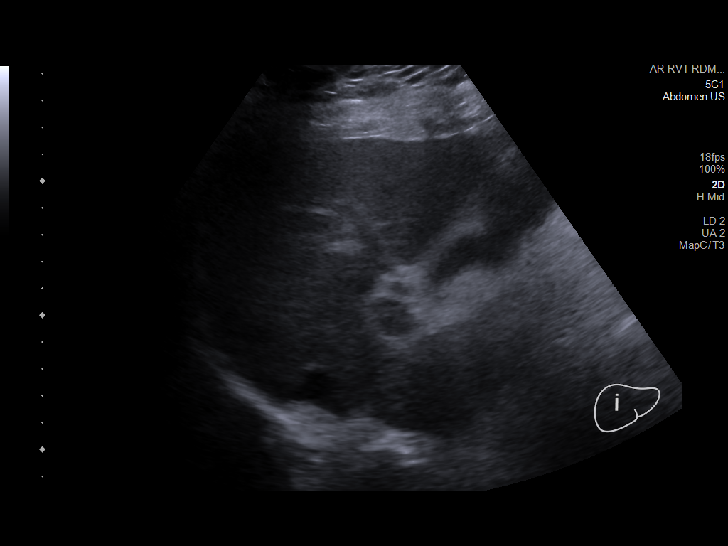
[im 43/52]
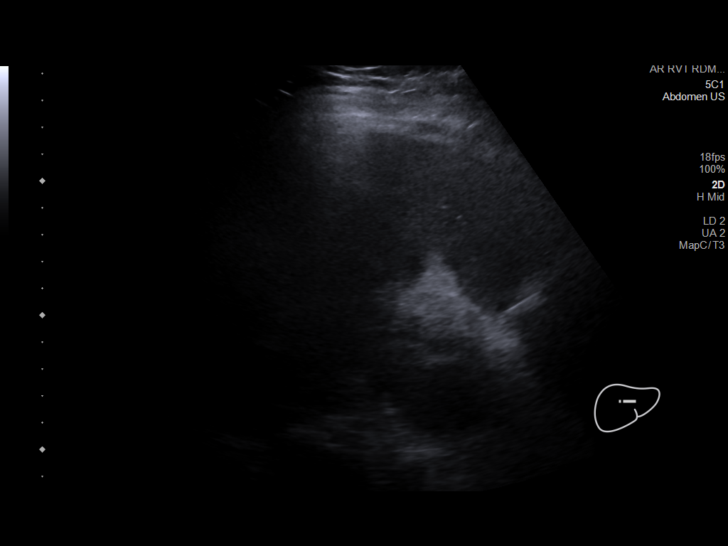
[im 47/52]
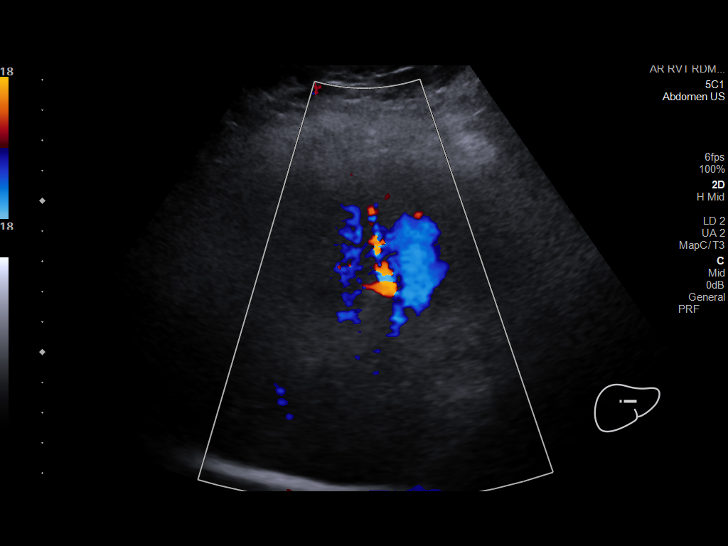
[im 52/52]
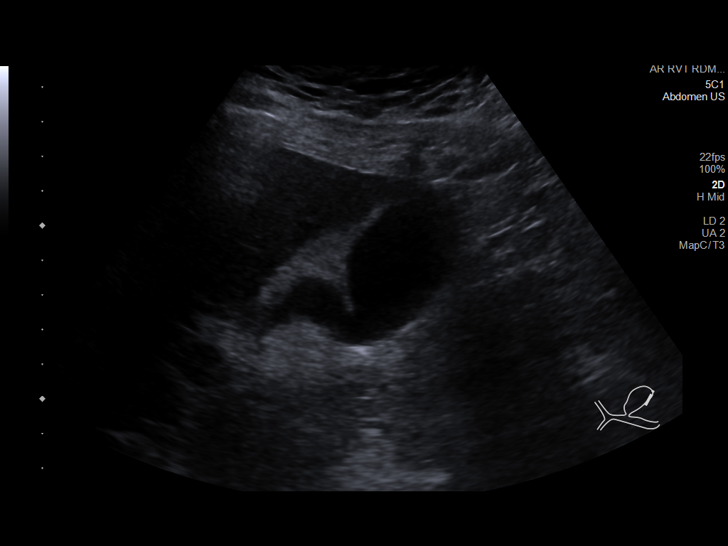

[14 of 25 positions shown; findings below may reference images not displayed]

FINDINGS: Gallbladder:

No gallstones or wall thickening visualized. No sonographic Murphy
sign noted by sonographer.

Common bile duct:

Diameter: 4 mm

Liver:

The patient's known right lobe hepatocellular carcinoma is not well
visualized on the current examination. Caudate lobe is heterogeneous
in appearance. Portal vein is patent on color Doppler imaging with
normal direction of blood flow towards the liver.

Other: None.
IMPRESSION: 1. Unremarkable sonographic appearance of the gallbladder.
2. Patient has known right lobe hepatocellular carcinoma is not well
visualized with the current examination. The caudate lobe is
heterogeneous in appearance. This may be due to hypertrophy related
to cirrhosis or new underlying mass.

These results will be called to the ordering clinician or
representative by the Radiologist Assistant, and communication
documented in the PACS or [REDACTED].

## 2020-06-21 IMAGING — DX DG CHEST 2V
2 series · 2 of 2 positions shown · non-contrast
Comparison: None.

CLINICAL DATA: COVID

Shortness of breath on exertion
Cough
Leukocytosis
EXAM:
CHEST - 2 VIEW

[chest lat]
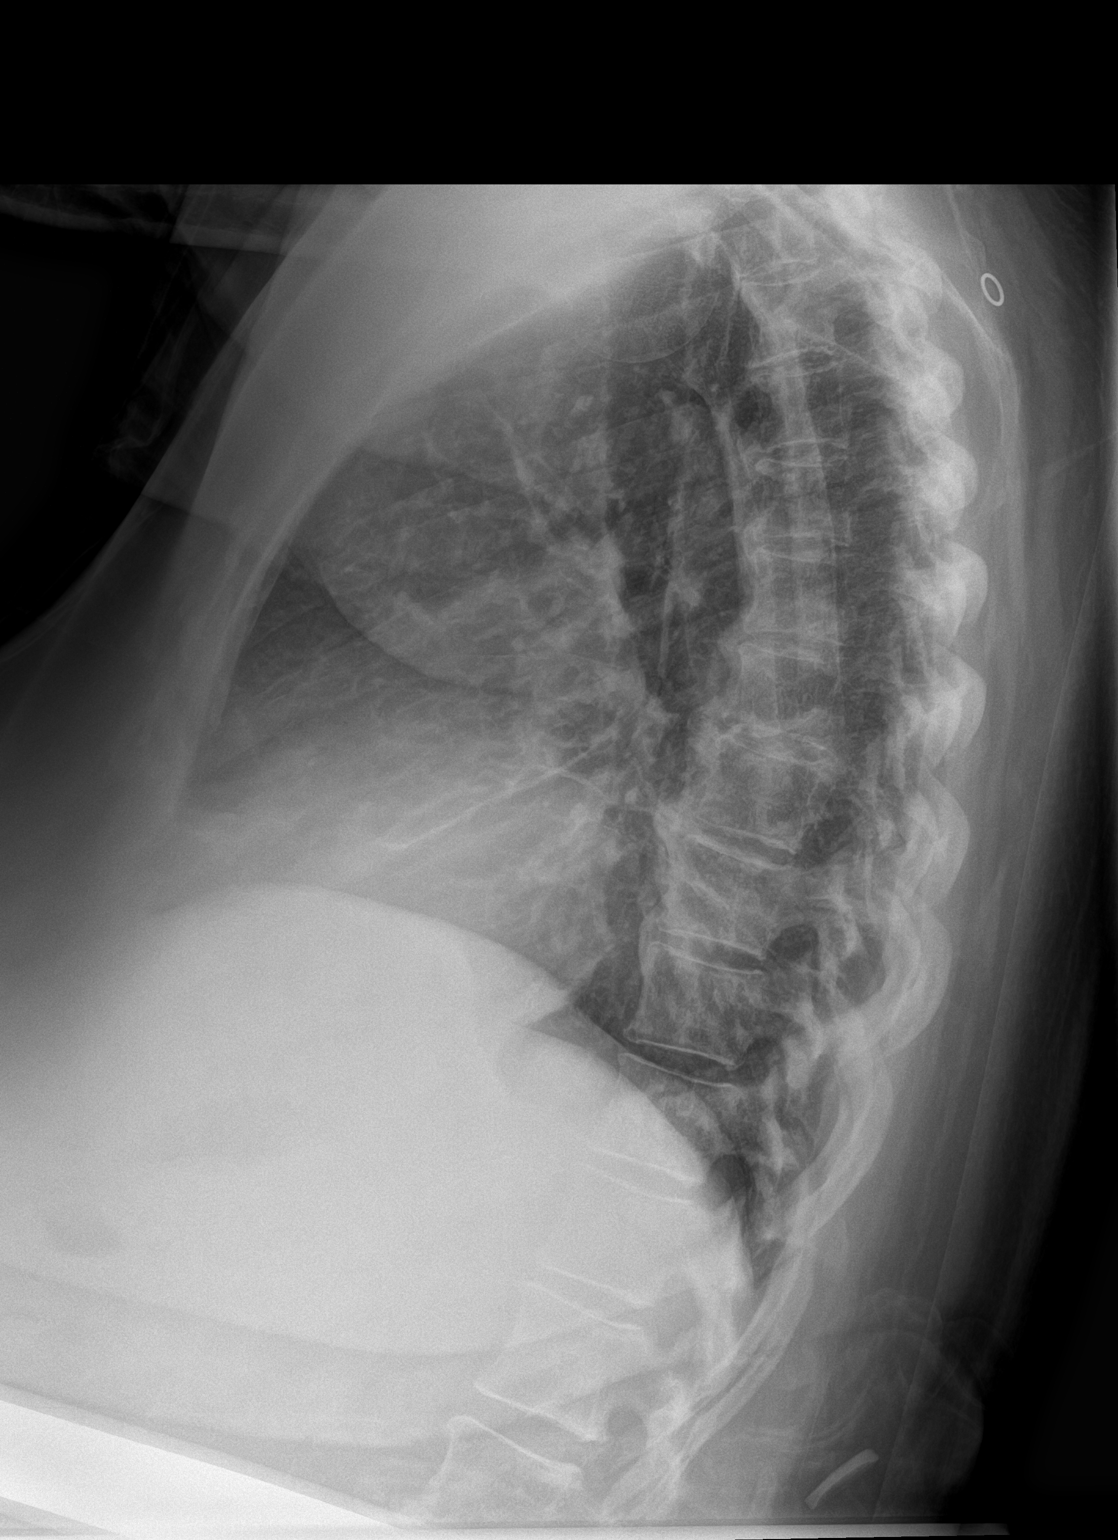

[chest ap]
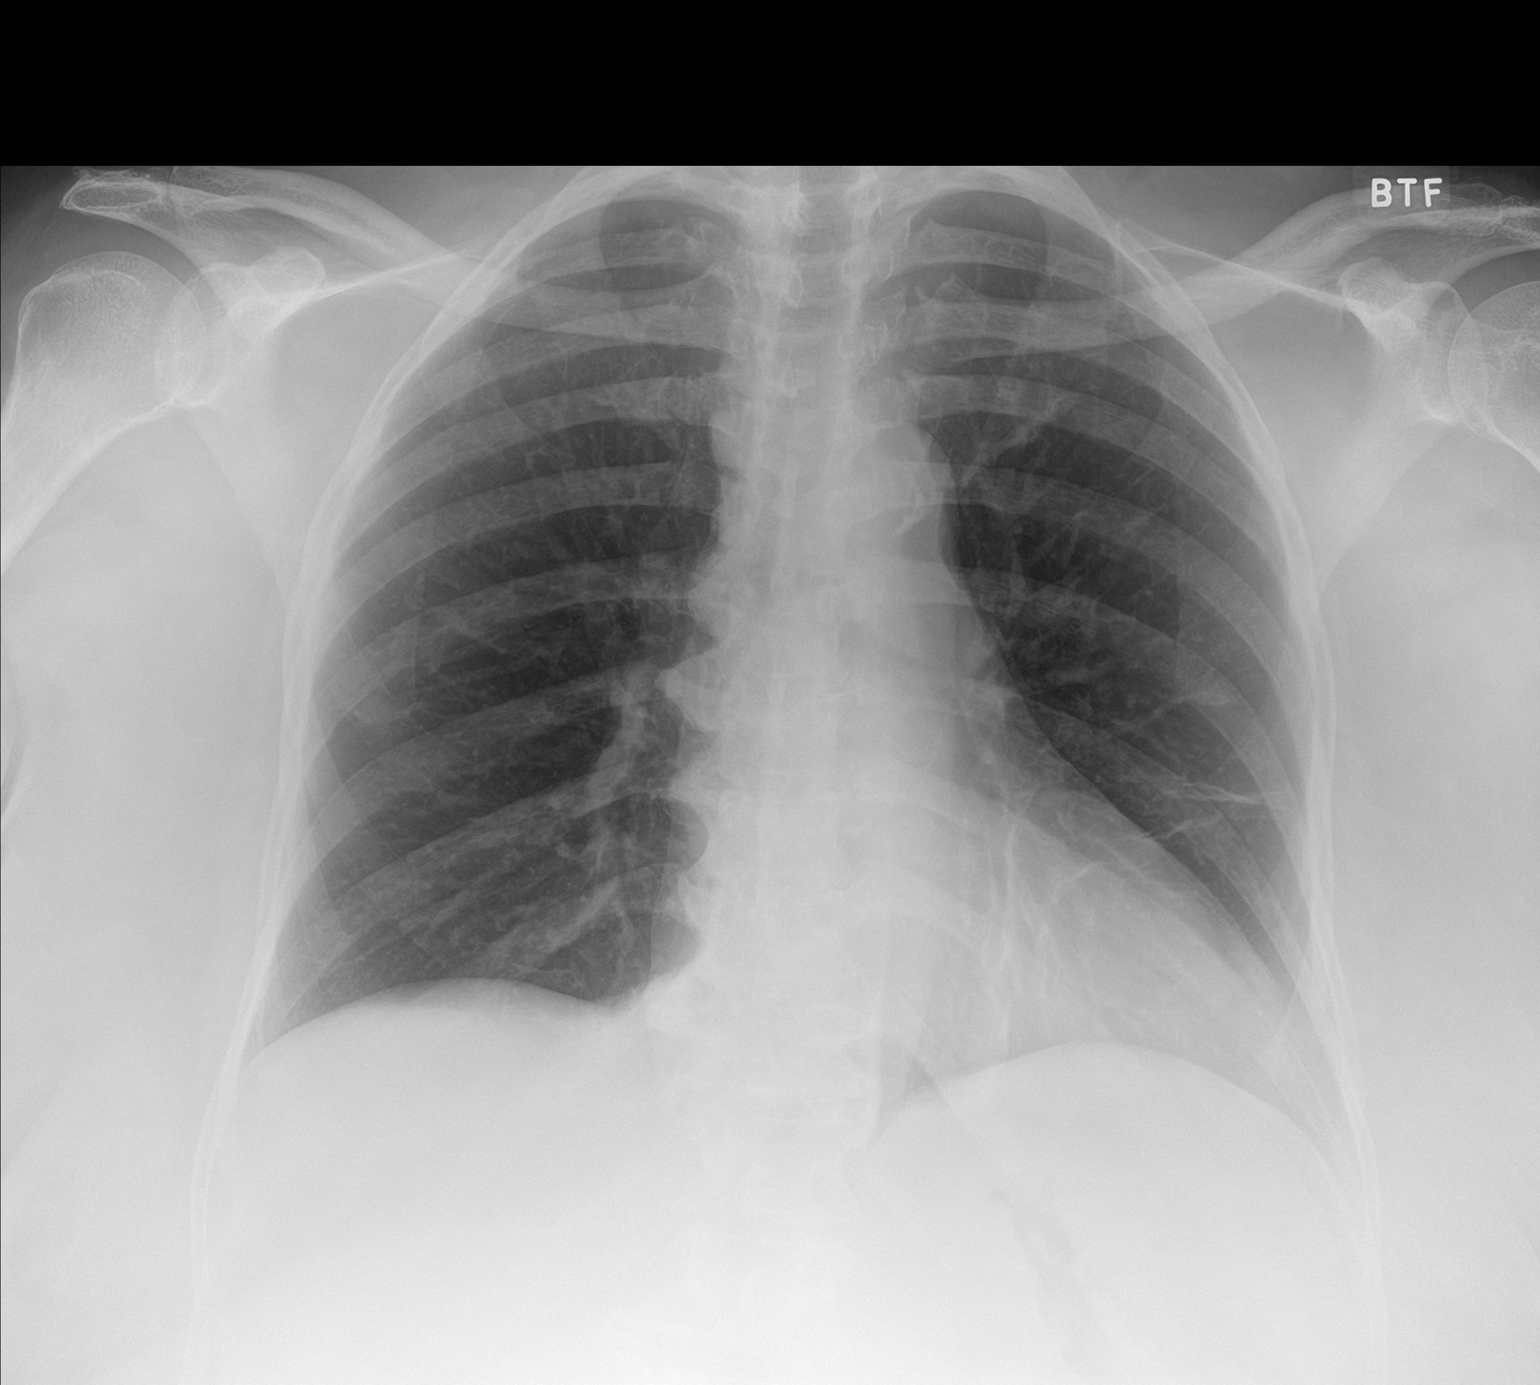

[2 of 2 positions shown; findings below may reference images not displayed]

FINDINGS: The heart size and mediastinal contours are within normal limits.
Linear opacities in the left mid lung consistent with atelectasis or
scarring. Lungs otherwise clear. The visualized skeletal structures
are unremarkable.
IMPRESSION: No acute cardiopulmonary process.

## 2020-06-22 LAB — SARS CORONAVIRUS 2 (TAT 6-24 HRS): SARS Coronavirus 2: NEGATIVE

## 2020-06-22 LAB — ANA W/REFLEX IF POSITIVE: Anti Nuclear Antibody (ANA): NEGATIVE

## 2020-06-22 LAB — IGG, IGA, IGM
IgA: 274 mg/dL (ref 64–422)
IgG (Immunoglobin G), Serum: 1275 mg/dL (ref 586–1602)
IgM (Immunoglobulin M), Srm: 27 mg/dL (ref 26–217)

## 2020-06-22 LAB — TISSUE TRANSGLUTAMINASE, IGA: Tissue Transglutaminase Ab, IgA: <2 U/mL (ref 0–3)

## 2020-06-23 LAB — ANTI-SMOOTH MUSCLE ANTIBODY, IGG: F-Actin IgG: 9 Units (ref 0–19)

## 2020-06-23 LAB — MITOCHONDRIAL ANTIBODIES: Mitochondrial M2 Ab, IgG: <20 Units (ref 0.0–20.0)

## 2020-06-25 ENCOUNTER — Encounter (HOSPITAL_COMMUNITY)
Admission: RE | Disposition: A | Discharge status: Home / Self Care | Payer: Self-pay | Source: Home / Self Care | Attending: Internal Medicine

## 2020-06-25 ENCOUNTER — Ambulatory Visit (HOSPITAL_COMMUNITY)
Admission: RE | Admit: 2020-06-25 | Discharge: 2020-06-25 | Disposition: A | Discharge status: Home / Self Care | Payer: Medicare Other | Attending: Internal Medicine | Admitting: Internal Medicine

## 2020-06-25 ENCOUNTER — Encounter (HOSPITAL_COMMUNITY): Payer: Self-pay | Admitting: Internal Medicine

## 2020-06-25 ENCOUNTER — Other Ambulatory Visit: Payer: Self-pay

## 2020-06-25 ENCOUNTER — Ambulatory Visit (HOSPITAL_COMMUNITY): Payer: Medicare Other | Admitting: Anesthesiology

## 2020-06-25 DIAGNOSIS — Z91041 Radiographic dye allergy status: Secondary | ICD-10-CM | POA: Insufficient documentation

## 2020-06-25 DIAGNOSIS — Z880 Allergy status to penicillin: Secondary | ICD-10-CM | POA: Diagnosis not present

## 2020-06-25 DIAGNOSIS — Z8673 Personal history of transient ischemic attack (TIA), and cerebral infarction without residual deficits: Secondary | ICD-10-CM | POA: Insufficient documentation

## 2020-06-25 DIAGNOSIS — K7460 Unspecified cirrhosis of liver: Secondary | ICD-10-CM | POA: Diagnosis not present

## 2020-06-25 DIAGNOSIS — Z794 Long term (current) use of insulin: Secondary | ICD-10-CM | POA: Diagnosis not present

## 2020-06-25 DIAGNOSIS — Z8616 Personal history of COVID-19: Secondary | ICD-10-CM | POA: Diagnosis not present

## 2020-06-25 DIAGNOSIS — I471 Supraventricular tachycardia: Secondary | ICD-10-CM

## 2020-06-25 DIAGNOSIS — R1013 Epigastric pain: Secondary | ICD-10-CM | POA: Diagnosis present

## 2020-06-25 DIAGNOSIS — Z79899 Other long term (current) drug therapy: Secondary | ICD-10-CM | POA: Insufficient documentation

## 2020-06-25 DIAGNOSIS — R000 Tachycardia, unspecified: Secondary | ICD-10-CM | POA: Insufficient documentation

## 2020-06-25 DIAGNOSIS — Z888 Allergy status to other drugs, medicaments and biological substances status: Secondary | ICD-10-CM | POA: Insufficient documentation

## 2020-06-25 DIAGNOSIS — Z87891 Personal history of nicotine dependence: Secondary | ICD-10-CM | POA: Insufficient documentation

## 2020-06-25 DIAGNOSIS — K219 Gastro-esophageal reflux disease without esophagitis: Secondary | ICD-10-CM | POA: Diagnosis not present

## 2020-06-25 DIAGNOSIS — I10 Essential (primary) hypertension: Secondary | ICD-10-CM | POA: Insufficient documentation

## 2020-06-25 DIAGNOSIS — Z85038 Personal history of other malignant neoplasm of large intestine: Secondary | ICD-10-CM | POA: Insufficient documentation

## 2020-06-25 DIAGNOSIS — E785 Hyperlipidemia, unspecified: Secondary | ICD-10-CM | POA: Diagnosis not present

## 2020-06-25 DIAGNOSIS — E119 Type 2 diabetes mellitus without complications: Secondary | ICD-10-CM | POA: Diagnosis not present

## 2020-06-25 DIAGNOSIS — Z538 Procedure and treatment not carried out for other reasons: Secondary | ICD-10-CM | POA: Insufficient documentation

## 2020-06-25 DIAGNOSIS — Z7982 Long term (current) use of aspirin: Secondary | ICD-10-CM | POA: Diagnosis not present

## 2020-06-25 DIAGNOSIS — C220 Liver cell carcinoma: Secondary | ICD-10-CM | POA: Insufficient documentation

## 2020-06-25 DIAGNOSIS — G4733 Obstructive sleep apnea (adult) (pediatric): Secondary | ICD-10-CM | POA: Insufficient documentation

## 2020-06-25 LAB — GLUCOSE, CAPILLARY
Glucose-Capillary: 145 mg/dL — ABNORMAL HIGH (ref 70–99)
Glucose-Capillary: 72 mg/dL (ref 70–99)
Glucose-Capillary: 99 mg/dL (ref 70–99)

## 2020-06-25 SURGERY — CANCELLED PROCEDURE
Anesthesia: General

## 2020-06-25 MED ORDER — METOPROLOL TARTRATE 5 MG/5ML IV SOLN
INTRAVENOUS | Status: AC
  Filled 2020-06-25: qty 10

## 2020-06-25 MED ORDER — LACTATED RINGERS IV SOLN: INTRAVENOUS | Status: DC

## 2020-06-25 MED ORDER — GLYCOPYRROLATE 0.2 MG/ML IJ SOLN
INTRAMUSCULAR | Status: AC
  Administered 2020-06-25: 0.2 mg via INTRAVENOUS
  Filled 2020-06-25: qty 1

## 2020-06-25 MED ORDER — METOPROLOL TARTRATE 5 MG/5ML IV SOLN
INTRAVENOUS | Status: DC | PRN
  Administered 2020-06-25: 2 mg via INTRAVENOUS
  Administered 2020-06-25: 1 mg via INTRAVENOUS
  Administered 2020-06-25: 2 mg via INTRAVENOUS
  Administered 2020-06-25: 3 mg via INTRAVENOUS
  Administered 2020-06-25: 2 mg via INTRAVENOUS

## 2020-06-25 MED ORDER — GLYCOPYRROLATE 0.2 MG/ML IJ SOLN: 0.2000 mg | Freq: Once | INTRAMUSCULAR | Status: AC

## 2020-06-25 MED ORDER — DEXTROSE 50 % IV SOLN
INTRAVENOUS | Status: AC
  Filled 2020-06-25: qty 50

## 2020-06-25 MED ORDER — LIDOCAINE VISCOUS HCL 2 % MT SOLN
OROMUCOSAL | Status: AC
  Administered 2020-06-25: 15 mL via OROMUCOSAL
  Filled 2020-06-25: qty 15

## 2020-06-25 MED ORDER — LIDOCAINE VISCOUS HCL 2 % MT SOLN: 15.0000 mL | Freq: Once | OROMUCOSAL | Status: AC

## 2020-06-25 MED ORDER — DEXTROSE 50 % IV SOLN
25.0000 mL | Freq: Once | INTRAVENOUS | Status: AC
  Administered 2020-06-25: 25 mL via INTRAVENOUS

## 2020-06-25 NOTE — Anesthesia Procedure Notes (Deleted)
Date/Time: 06/25/2020 10:44 AM Performed by: Orlie Dakin, CRNA Pre-anesthesia Checklist: Patient identified, Emergency Drugs available, Suction available and Patient being monitored Patient Re-evaluated:Patient Re-evaluated prior to induction Oxygen Delivery Method: Nasal cannula Induction Type: IV induction Placement Confirmation: positive ETCO2

## 2020-06-25 NOTE — Consult Note (Signed)
Cardiology Consultation:   Patient ID: Veronica Wiley; [id number]; 10/16/1945   Admit date: 06/25/2020 Date of Consult: 06/25/2020  Primary Care Provider: Michell Heinrich, DO Primary Cardiologist: Satira Sark, MD   Patient Profile:   Veronica Wiley is a 75 y.o. female with a history of hepatocellular carcinoma, GERD, hypertension, hyperlipidemia, previous colon cancer, stroke, type 2 diabetes mellitus, and OSA who is being seen today for the evaluation of persistent tachycardia at the request of Dr. Griselda Miner.  History of Present Illness:   Ms. Veronica Wiley presented today to [facility] for an outpatient EGD by Dr. Gala Wiley, to be performed with propofol.  She was prepped for the procedure by anesthesia, received viscous lidocaine and also a dose of glycopyrrolate.  While being transferred in to the procedure suite she was noted to have an increase in heart rate from baseline in the 80s up to the 120s and 130s.  Regular SVT noted by telemetry monitoring, unfortunately transition in heart rate was not captured.  EGD was canceled and she was transferred into the PACU.  She did receive IV Lopressor in divided fashion up to a total of 10 mg with heart rate continuing to hover around 115 bpm.  She was otherwise hemodynamically stable.  Did report an unusual feeling in her chest with elevated heartbeat.  States that she had not felt any typical palpitations as an outpatient.  No prior documented cardiac arrhythmia.  Baseline tracing from March 3 revealed sinus rhythm with borderline short PR interval, anteroseptal Q waves with nonspecific ST segment changes.  Subsequent tracing while in the PACU with elevated heart rate showed different P wave morphology with question of long RP tachycardia -possibly atrial tachycardia or potentially atypical atrial flutter, although reentrant mechanism also possible.  Vagal maneuver was performed without effect.  Plan was then to proceed with adenosine 6 mg IV push (as either  a diagnostic or therapeutic measure), just as this was getting ready to be performed, patient spontaneously converted to sinus rhythm in the 60s.  This was confirmed by follow-up ECG which also showed leftward axis and poor R wave progression in comparison to the most recent tracing in sinus rhythm although in the absence of any chest discomfort.   Past Medical History:  Diagnosis Date  . Adjustment disorder with depressed mood   . Anxiety   . Arthritis   . Cirrhosis ([place]) 03/2020  . Depression   . Dysphagia   . GERD (gastroesophageal reflux disease)   . Hepatocellular carcinoma (Veronica Wiley) 03/2020  . History of breast cancer 2020  . HTN (hypertension)   . Hyperlipidemia   . Malignant tumor of colon (Veronica Wiley)    Around 2006  . OSA (obstructive sleep apnea)    on CPAP  . 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 Type 2 diabetes mellitus with complications ([facility])   . Vitamin D deficiency     Past Surgical History:  Procedure Laterality Date  . ABDOMINAL HYSTERECTOMY    . BIOPSY  07/01/2019   Procedure: BIOPSY;  Surgeon: Veronica Dolin, MD;  Location: [facility];  Service: Endoscopy;;  esophageal, gastric  . BREAST LUMPECTOMY Right 2020   x3  . COLON RESECTION     about 15 years ago; left-sided (per patient) (documented 07/2019)  . COLONOSCOPY  07/28/2016   Dr. Earley Veronica Wiley; hemorrhoids, diverticulosis (few in the sigmoid colon), colon polyps (two-3 mm polyps biopsied from the rectum).  Pathology revealed hyperplastic polyp.  Next colonoscopy  due in 2023.  Veronica Wiley ESOPHAGOGASTRODUODENOSCOPY  05/21/2016   Dr. Earley Veronica Wiley; hiatal hernia, polyps (2-3 mm inflammatory looking polyps in the antrum, biopsies taken).  Esophageal balloon dilation performed.  . ESOPHAGOGASTRODUODENOSCOPY N/A 07/01/2019   Procedure: ESOPHAGOGASTRODUODENOSCOPY (EGD);  Surgeon: Veronica Dolin, MD;   mucosal changes in the esophagus (benign biopsy) but otherwise normal-appearing s/p empiric dilation, nodular  proximal gastric mucosa (biopsy with mild chronic gastritis, no H. Pylori).    Veronica Wiley MALONEY DILATION N/A 07/01/2019   Procedure: Veronica Wiley DILATION;  Surgeon: Veronica Dolin, MD;  Location: [facility];  Service: Endoscopy;  Laterality: N/A;     Outpatient Medications: No current facility-administered medications on file prior to encounter.   Current Outpatient Medications on File Prior to Encounter  Medication Sig Dispense Refill  . aspirin EC 81 MG tablet Take 81 mg by mouth daily.    Veronica Wiley atorvastatin (LIPITOR) 80 MG tablet Take 80 mg by mouth daily.    . Cholecalciferol (VITAMIN D3) 50 MCG (2000 UT) capsule Take 2,000 Units by mouth daily.    . clopidogrel (PLAVIX) 75 MG tablet Take 75 mg by mouth daily.    Veronica Wiley DEXILANT 60 MG capsule TAKE 1 CAPSULE BY MOUTH EVERY DAY (Patient taking differently: Take 60 mg by mouth daily.) 90 capsule 1  . dorzolamide-timolol (COSOPT) 22.3-6.8 MG/ML ophthalmic solution Place 1 drop into both eyes daily.    . insulin degludec (TRESIBA FLEXTOUCH) 100 UNIT/ML FlexTouch Pen Inject into the skin daily.    Veronica Wiley letrozole (FEMARA) 2.5 MG tablet Take 2.5 mg by mouth daily.    Veronica Wiley losartan (COZAAR) 25 MG tablet Take 25 mg by mouth daily as needed (If blood pressure is over 140).     . mirtazapine (REMERON SOL-TAB) 15 MG disintegrating tablet Take 15 mg by mouth at bedtime.    . ondansetron (ZOFRAN-ODT) 4 MG disintegrating tablet Take 4 mg by mouth every 8 (eight) hours as needed for nausea or vomiting.    . sertraline (ZOLOFT) 100 MG tablet Take 150 mg by mouth daily.     . traMADol (ULTRAM) 50 MG tablet Take 50 mg by mouth every 6 (six) hours as needed for severe pain.    . Insulin Infusion Pump DEVI by Does not apply route. Basal rate 225 ( not sure) Novolog      Allergies:    Allergies  Allergen Reactions  . Enoxaparin Other (See Comments)    Hallucinating  . Ivp Dye [Iodinated Diagnostic Agents] Other (See Comments)    Body got red and bumps  . Penicillins Rash     Did it involve swelling of the face/tongue/throat, SOB, or low BP? No Did it involve sudden or severe rash/hives, skin peeling, or any reaction on the inside of your mouth or nose? No Did you need to seek medical attention at a hospital or doctor's office? No When did it last happen?20  years If all above answers are "NO", may proceed with cephalosporin use.    Social History:   Social History   Tobacco Use  . Smoking status: Former Research scientist (life sciences)  . Smokeless tobacco: Never Used  . Tobacco comment: Never did smoke much just one or two daily  Substance Use Topics  . Alcohol use: Not Currently    Family History:   The patient's family history includes Hypertension in her father. There is no history of Colon cancer.  ROS:  Please see the history of present illness.  All other ROS reviewed and negative.  Physical Exam/Data:   Vitals:   06/25/20 1215 06/25/20 1230 06/25/20 1245 06/25/20 1332  BP: 129/78 138/73 (!) 110/51 (!) 118/57  Pulse: 65 69 61 63  Resp: 16 18 16 16   Temp:   97.7 F (36.5 C) 97.8 F (36.6 C)  TempSrc:    Oral  SpO2: 100% 100% 95% 98%  Weight:      Height:       No intake or output data in the 24 hours ending 06/25/20 1532 Filed Weights   06/25/20 1016  Weight: 77 kg   Body mass index is 31.05 kg/m.   Gen: Patient appears comfortable at rest. HEENT: Conjunctiva and lids normal, oropharynx clear. Neck: Supple, no elevated JVP or carotid bruits, no thyromegaly. Lungs: Clear to auscultation, nonlabored breathing at rest. Cardiac: Rapid regular rhythm while in SVT, no S3 or significant systolic murmur, no pericardial rub. Abdomen: Soft, nontender, bowel sounds present. Extremities: No pitting edema, distal pulses 2+. Skin: Warm and dry. Musculoskeletal: No kyphosis. Neuropsychiatric: Alert and oriented x3, affect grossly appropriate.  Telemetry:  I personally reviewed telemetry which shows SVT.  Relevant CV Studies:  No prior studies for  review.  Laboratory Data:  No recent lab work for review.  Radiology/Studies:  No results found.  Assessment and Plan:   1.  Probable atrial tachycardia, potentially associated with glycopyrrolate.  No prior history of cardiac arrhythmia.  Patient ultimately converted spontaneously to sinus rhythm and did not require intervention beyond initial doses of IV Lopressor.  At this point no further cardiac work-up is anticipated unless she has recurring evidence of arrhythmia at baseline.  2.  Hepatocellular carcinoma pending further evaluation at [facility].  3.  Essential hypertension at baseline, on losartan as an outpatient.  She remained hemodynamically stable throughout above.   Signed, Rozann Lesches, MD  06/25/2020 3:32 PM

## 2020-06-25 NOTE — Anesthesia Preprocedure Evaluation (Addendum)
Anesthesia Evaluation  Patient identified by MRN, date of birth, ID band Patient awake    Reviewed: Allergy & Precautions, NPO status , Patient's Chart, lab work & pertinent test results  History of Anesthesia Complications Negative for: history of anesthetic complications  Airway Mallampati: II  TM Distance: >3 FB     Dental  (+) Dental Advisory Given, Upper Dentures, Missing   Pulmonary sleep apnea and Continuous Positive Airway Pressure Ventilation , former smoker,    Pulmonary exam normal breath sounds clear to auscultation       Cardiovascular Exercise Tolerance: Good hypertension, Pt. on medications Normal cardiovascular exam Rhythm:Regular Rate:Normal     Neuro/Psych PSYCHIATRIC DISORDERS Anxiety Depression CVA, Residual Symptoms    GI/Hepatic GERD  Medicated,(+) Cirrhosis  (hepatocellular carcinoma)      , Colon cancer   Endo/Other  diabetes, Well Controlled, Type 2, Insulin Dependent  Renal/GU      Musculoskeletal  (+) Arthritis , Osteoarthritis,    Abdominal   Peds  Hematology  (+) anemia ,   Anesthesia Other Findings   Reproductive/Obstetrics                            Anesthesia Physical Anesthesia Plan  ASA: III  Anesthesia Plan: General   Post-op Pain Management:    Induction: Intravenous  PONV Risk Score and Plan: TIVA  Airway Management Planned: Nasal Cannula and Natural Airway  Additional Equipment:   Intra-op Plan:   Post-operative Plan:   Informed Consent: I have reviewed the patients History and Physical, chart, labs and discussed the procedure including the risks, benefits and alternatives for the proposed anesthesia with the patient or authorized representative who has indicated his/her understanding and acceptance.     Dental advisory given  Plan Discussed with: CRNA and Surgeon  Anesthesia Plan Comments:         Anesthesia Quick  Evaluation

## 2020-06-25 NOTE — Interval H&P Note (Signed)
History and Physical Interval Note:  06/25/2020 11:10 AM  [name]  has presented today for surgery, with the diagnosis of epigastric pain, early satiety, dysphagia, gerd, melena, cirrhosis.  The various methods of treatment have been discussed with the patient and family. After consideration of risks, benefits and other options for treatment, the patient has consented to  Procedure(s) with comments: ESOPHAGOGASTRODUODENOSCOPY (EGD) WITH PROPOFOL (N/A) - diabetic if can be earliest as possible. MALONEY DILATION (N/A) as a surgical intervention.  The patient's history has been reviewed, patient examined, no change in status, stable for surgery.  I have reviewed the patient's chart and labs.  Questions were answered to the patient's satisfaction.     Veronica Wiley  Heart rhythm and rate unsatisfactory for anesthesia to provide anesthesia today.  Anesthesia is canceled the procedure.

## 2020-06-25 NOTE — Transfer of Care (Signed)
Immediate Anesthesia Transfer of Care Note  Patient: Veronica Wiley  Procedure(s) Performed: ESOPHAGOGASTRODUODENOSCOPY (EGD) WITH PROPOFOL (N/A ) MALONEY DILATION (N/A )  Patient Location: PACU  Anesthesia Type:No sedation, case cancelled.  Level of Consciousness: awake, alert  and oriented  Airway & Oxygen Therapy: Patient Spontanous Breathing  Post-op Assessment: Report given to RN and Post -op Vital signs reviewed and stable  Post vital signs: Reviewed and stable  Last Vitals:  Vitals Value Taken Time  BP    Temp    Pulse    Resp    SpO2      Last Pain:  Vitals:   06/25/20 1040  TempSrc:   PainSc: 2       Patients Stated Pain Goal: 7 (64/68/03 2122)  Complications: No complications documented.

## 2020-06-25 NOTE — Anesthesia Postprocedure Evaluation (Signed)
Anesthesia Post Note  Patient: Veronica Wiley  Procedure(s) Performed: Procedure name not found.  Patient location during evaluation: PACU (patient was tachycardic in OR, procedure cancelled and did not receive anesthesia ) Anesthesia plan: patient was tachycardic in OR, procedure cancelled and did not receive anesthesia  Level of consciousness: awake and alert and oriented Cardiovascular status: blood pressure returned to baseline and stable Postop Assessment: adequate PO intake and able to ambulate Anesthetic complications: no   No complications documented.   Last Vitals:  Vitals:   06/25/20 1245 06/25/20 1332  BP: (!) 110/51 (!) 118/57  Pulse: 61 63  Resp: 16 16  Temp: 36.5 C 36.6 C  SpO2: 95% 98%    Last Pain:  Vitals:   06/25/20 1332  TempSrc: Oral  PainSc: 0-No pain                 Katrena Stehlin C Cashmere Harmes

## 2020-06-25 NOTE — Discharge Instructions (Addendum)
Your heart rhythm and rate were unsatisfactory for anesthesia to put you to sleep for the procedure today  Therefore, procedure was canceled.  At this point time I recommend you follow-up with your primary care physician  It is very important to keep your appointment down at [facility] on March 23 as scheduled.  At patient request, I called Lynett Grimes at [phone] and informed her that today's procedure was canceled today out of abundance of caution and safety.

## 2020-06-25 NOTE — Interval H&P Note (Signed)
History and Physical Interval Note:  06/25/2020 10:27 AM  Veronica Wiley  has presented today for surgery, with the diagnosis of epigastric pain, early satiety, dysphagia, gerd, melena, cirrhosis.  The various methods of treatment have been discussed with the patient and family. After consideration of risks, benefits and other options for treatment, the patient has consented to  Procedure(s) with comments: ESOPHAGOGASTRODUODENOSCOPY (EGD) WITH PROPOFOL (N/A) - diabetic if can be earliest as possible. MALONEY DILATION (N/A) as a surgical intervention.  The patient's history has been reviewed, patient examined, no change in status, stable for surgery.  I have reviewed the patient's chart and labs.  Questions were answered to the patient's satisfaction.     Adonai Helzer  No change.  Seeing the doctors at [facility] on the 23rd of this month.  Upper abdominal pain.  Very vague intermittent dysphagia.  Prior EGD normal-empiric dilation.  Subsequent BPE also normal.  I told the patient unless there is lesion found which was not identified previously, would likely not dilate her esophagus today. The risks, benefits, limitations, alternatives and imponderables have been reviewed with the patient. Potential for esophageal dilation, biopsy, etc. have also been reviewed.  Questions have been answered. All parties agreeable.   Further recommendations to follow.

## 2020-08-19 DEATH — deceased

## 2020-08-20 ENCOUNTER — Encounter (HOSPITAL_COMMUNITY): Payer: Self-pay

## 2020-09-17 ENCOUNTER — Other Ambulatory Visit: Payer: Self-pay | Admitting: Gastroenterology
